# Patient Record
Sex: Female | Born: 1975
Health system: Southern US, Community
[De-identification: ages and names within clinical notes are randomized; demographics above are authoritative.]

## PROBLEM LIST (undated history)

## (undated) DIAGNOSIS — Z87898 Personal history of other specified conditions: Secondary | ICD-10-CM

## (undated) DIAGNOSIS — J301 Allergic rhinitis due to pollen: Secondary | ICD-10-CM

## (undated) DIAGNOSIS — D689 Coagulation defect, unspecified: Secondary | ICD-10-CM

## (undated) DIAGNOSIS — B019 Varicella without complication: Secondary | ICD-10-CM

## (undated) DIAGNOSIS — J45909 Unspecified asthma, uncomplicated: Secondary | ICD-10-CM

## (undated) DIAGNOSIS — R011 Cardiac murmur, unspecified: Secondary | ICD-10-CM

## (undated) DIAGNOSIS — F419 Anxiety disorder, unspecified: Secondary | ICD-10-CM

## (undated) DIAGNOSIS — M419 Scoliosis, unspecified: Secondary | ICD-10-CM

## (undated) HISTORY — PX: OTHER SURGICAL HISTORY: SHX169

## (undated) HISTORY — DX: Cardiac murmur, unspecified: R01.1

## (undated) HISTORY — DX: Coagulation defect, unspecified: D68.9

## (undated) HISTORY — DX: Scoliosis, unspecified: M41.9

## (undated) HISTORY — DX: Unspecified asthma, uncomplicated: J45.909

## (undated) HISTORY — DX: Varicella without complication: B01.9

## (undated) HISTORY — PX: WISDOM TOOTH EXTRACTION: SHX21

## (undated) HISTORY — DX: Allergic rhinitis due to pollen: J30.1

## (undated) HISTORY — DX: Personal history of other specified conditions: Z87.898

---

## 2006-09-21 ENCOUNTER — Encounter: Payer: Self-pay | Admitting: Maternal & Fetal Medicine

## 2006-11-04 ENCOUNTER — Ambulatory Visit: Payer: Self-pay

## 2006-11-17 ENCOUNTER — Ambulatory Visit: Payer: Self-pay | Admitting: Unknown Physician Specialty

## 2006-12-21 ENCOUNTER — Inpatient Hospital Stay: Payer: Self-pay | Admitting: Obstetrics & Gynecology

## 2007-08-19 LAB — CONVERTED CEMR LAB

## 2009-09-13 ENCOUNTER — Ambulatory Visit: Payer: Self-pay | Admitting: Family Medicine

## 2009-09-13 DIAGNOSIS — J019 Acute sinusitis, unspecified: Secondary | ICD-10-CM | POA: Insufficient documentation

## 2009-09-13 DIAGNOSIS — R002 Palpitations: Secondary | ICD-10-CM | POA: Insufficient documentation

## 2009-09-13 LAB — CONVERTED CEMR LAB
Albumin: 4.3 g/dL (ref 3.5–5.2)
Basophils Relative: 0.3 % (ref 0.0–3.0)
CO2: 30 meq/L (ref 19–32)
Calcium: 9.6 mg/dL (ref 8.4–10.5)
Eosinophils Relative: 1.5 % (ref 0.0–5.0)
HDL: 62.3 mg/dL (ref >39.00)
LDL Cholesterol: 85 mg/dL (ref 0–99)
Lymphocytes Relative: 30 % (ref 12.0–46.0)
MCV: 89.2 fL (ref 78.0–100.0)
Monocytes Absolute: 0.7 10*3/uL (ref 0.1–1.0)
Monocytes Relative: 12.5 % — ABNORMAL HIGH (ref 3.0–12.0)
Neutrophils Relative %: 55.7 % (ref 43.0–77.0)
RBC: 4.35 M/uL (ref 3.87–5.11)
Sodium: 141 meq/L (ref 135–145)
TSH: 1.67 microintl units/mL (ref 0.35–5.50)
Total CHOL/HDL Ratio: 3
Total Protein: 7.4 g/dL (ref 6.0–8.3)
Triglycerides: 55 mg/dL (ref 0.0–149.0)
VLDL: 11 mg/dL (ref 0.0–40.0)
WBC: 5.5 10*3/uL (ref 4.5–10.5)

## 2010-03-15 ENCOUNTER — Ambulatory Visit: Payer: Self-pay | Admitting: Family Medicine

## 2010-03-15 DIAGNOSIS — K625 Hemorrhage of anus and rectum: Secondary | ICD-10-CM | POA: Insufficient documentation

## 2010-03-15 LAB — CONVERTED CEMR LAB: OCCULT 1: POSITIVE

## 2010-11-26 NOTE — Assessment & Plan Note (Signed)
Summary: RECTAL BLEEDING/DLO   Vital Signs:  Patient profile:   35 year old female Height:      65 inches Weight:      144.13 pounds BMI:     24.07 Temp:     98.2 degrees F oral Pulse rate:   72 / minute BP sitting:   92 / 80  (left arm) Cuff size:   regular  Vitals Entered By: Linde Gillis CMA [facility]) (Mar 15, 2010 12:36 PM) CC: rectal bleeding   History of Present Illness: 35 yo with 2 days of rectal bleeding. bright red blood per rectum, painful to sit.  No pain with defecation. No blood in toilet. No mucous.  No rectal itching.  Has not been constipated. No h/o rectal bleeding in past. Does have h/o vericose veins.  No dizziness, CP, or SOB.  Of note, has pos  Factor V Leiden.  Current Medications (verified): 1)  Folic Acid 1 Mg Tabs (Folic Acid) .... Take 1 Tablet By Mouth Once A Day 2)  Calcium Carbonate-Vitamin D 600-400 Mg-Unit  Tabs (Calcium Carbonate-Vitamin D) .... Take 1 Tablet By Mouth Once A Day 3)  Loratadine 10 Mg Tabs (Loratadine) .... Take 1 Tablet By Mouth Once A Day 4)  Multivitamins   Tabs (Multiple Vitamin) .... Take 1 Tablet By Mouth Once A Day 5)  Sertraline Hcl 50 Mg Tabs (Sertraline Hcl) .... Take Two Weeks Before Onset of Mentral Cycle  Allergies: 1)  ! Penicillin 2)  ! Sulfa  Review of Systems      See HPI General:  Denies chills and fever. GI:  Complains of bloody stools; denies abdominal pain, constipation, dark tarry stools, nausea, and vomiting.  Physical Exam  General:  Well-developed,well-nourished,in no acute distress; alert,appropriate and cooperative throughout examination Rectal:  stool positive for occult blood and external hemorrhoid(s).   Psych:  normally interactive, good eye contact, not anxious appearing, and not depressed appearing.     Impression & Recommendations:  Problem # 1:  RECTAL BLEEDING (ICD-569.3) Assessment New External hemorrhoid- does not appear thromboses. Discussed supportive care. See pt  instructions for details. Orders: Hemoccult Guaiac-1 spec.(in office) ([id number])  Complete Medication List: 1)  Folic Acid 1 Mg Tabs (Folic acid) .... Take 1 tablet by mouth once a day 2)  Calcium Carbonate-vitamin D 600-400 Mg-unit Tabs (Calcium carbonate-vitamin d) .... Take 1 tablet by mouth once a day 3)  Loratadine 10 Mg Tabs (Loratadine) .... Take 1 tablet by mouth once a day 4)  Multivitamins Tabs (Multiple vitamin) .... Take 1 tablet by mouth once a day 5)  Sertraline Hcl 50 Mg Tabs (Sertraline hcl) .... Take two weeks before onset of mentral cycle  Patient Instructions: 1)  Sitz baths several times a day as tolerated. 2)  you can try OTC preparations like preparation H, anusol, etc but they may not relieve many of your symptoms. 3)  Takes time. 4)  Increase fiber consumption. 5)  Call MD on call or go to ER if bleeding increases.  Current Allergies (reviewed today): ! PENICILLIN ! SULFA  Laboratory Results    Wet Mount/KOH  Stool - Occult Blood Hemmoccult #1: positive Date: 03/15/2010

## 2013-01-28 LAB — HM PAP SMEAR: HM Pap smear: NORMAL

## 2014-08-31 ENCOUNTER — Emergency Department: Payer: Self-pay | Admitting: Emergency Medicine

## 2015-01-29 ENCOUNTER — Ambulatory Visit: Payer: Self-pay | Admitting: Nurse Practitioner

## 2015-01-29 ENCOUNTER — Encounter: Payer: Self-pay | Admitting: Nurse Practitioner

## 2015-01-29 ENCOUNTER — Ambulatory Visit (INDEPENDENT_AMBULATORY_CARE_PROVIDER_SITE_OTHER): Payer: BLUE CROSS/BLUE SHIELD | Admitting: Nurse Practitioner

## 2015-01-29 VITALS — BP 108/72 | HR 58 | Temp 98.3°F | Resp 12 | Ht 66.0 in | Wt 146.4 lb

## 2015-01-29 DIAGNOSIS — Z7689 Persons encountering health services in other specified circumstances: Secondary | ICD-10-CM

## 2015-01-29 DIAGNOSIS — Z7189 Other specified counseling: Secondary | ICD-10-CM

## 2015-01-29 DIAGNOSIS — Z1329 Encounter for screening for other suspected endocrine disorder: Secondary | ICD-10-CM

## 2015-01-29 DIAGNOSIS — Z131 Encounter for screening for diabetes mellitus: Secondary | ICD-10-CM

## 2015-01-29 DIAGNOSIS — Z0000 Encounter for general adult medical examination without abnormal findings: Secondary | ICD-10-CM | POA: Insufficient documentation

## 2015-01-29 DIAGNOSIS — O99119 Other diseases of the blood and blood-forming organs and certain disorders involving the immune mechanism complicating pregnancy, unspecified trimester: Secondary | ICD-10-CM

## 2015-01-29 DIAGNOSIS — R238 Other skin changes: Secondary | ICD-10-CM

## 2015-01-29 DIAGNOSIS — D6851 Activated protein C resistance: Secondary | ICD-10-CM

## 2015-01-29 DIAGNOSIS — Z13220 Encounter for screening for lipoid disorders: Secondary | ICD-10-CM

## 2015-01-29 DIAGNOSIS — O9989 Other specified diseases and conditions complicating pregnancy, childbirth and the puerperium: Secondary | ICD-10-CM

## 2015-01-29 NOTE — Patient Instructions (Signed)
Smoking Cessation Quitting smoking is important to your health and has many advantages. However, it is not always easy to quit since nicotine is a very addictive drug. Oftentimes, people try 3 times or more before being able to quit. This document explains the best ways for you to prepare to quit smoking. Quitting takes hard work and a lot of effort, but you can do it. ADVANTAGES OF QUITTING SMOKING  You will live longer, feel better, and live better.  Your body will feel the impact of quitting smoking almost immediately.  Within 20 minutes, blood pressure decreases. Your pulse returns to its normal level.  After 8 hours, carbon monoxide levels in the blood return to normal. Your oxygen level increases.  After 24 hours, the chance of having a heart attack starts to decrease. Your breath, hair, and body stop smelling like smoke.  After 48 hours, damaged nerve endings begin to recover. Your sense of taste and smell improve.  After 72 hours, the body is virtually free of nicotine. Your bronchial tubes relax and breathing becomes easier.  After 2 to 12 weeks, lungs can hold more air. Exercise becomes easier and circulation improves.  The risk of having a heart attack, stroke, cancer, or lung disease is greatly reduced.  After 1 year, the risk of coronary heart disease is cut in half.  After 5 years, the risk of stroke falls to the same as a nonsmoker.  After 10 years, the risk of lung cancer is cut in half and the risk of other cancers decreases significantly.  After 15 years, the risk of coronary heart disease drops, usually to the level of a nonsmoker.  If you are pregnant, quitting smoking will improve your chances of having a healthy baby.  The people you live with, especially any children, will be healthier.  You will have extra money to spend on things other than cigarettes. QUESTIONS TO THINK ABOUT BEFORE ATTEMPTING TO QUIT You may want to talk about your answers with your  health care provider.  Why do you want to quit?  If you tried to quit in the past, what helped and what did not?  What will be the most difficult situations for you after you quit? How will you plan to handle them?  Who can help you through the tough times? Your family? Friends? A health care provider?  What pleasures do you get from smoking? What ways can you still get pleasure if you quit? Here are some questions to ask your health care provider:  How can you help me to be successful at quitting?  What medicine do you think would be best for me and how should I take it?  What should I do if I need more help?  What is smoking withdrawal like? How can I get information on withdrawal? GET READY  Set a quit date.  Change your environment by getting rid of all cigarettes, ashtrays, matches, and lighters in your home, car, or work. Do not let people smoke in your home.  Review your past attempts to quit. Think about what worked and what did not. GET SUPPORT AND ENCOURAGEMENT You have a better chance of being successful if you have help. You can get support in many ways.  Tell your family, friends, and coworkers that you are going to quit and need their support. Ask them not to smoke around you.  Get individual, group, or telephone counseling and support. Programs are available at local hospitals and health centers. Call   your local health department for information about programs in your area.  Spiritual beliefs and practices may help some smokers quit.  Download a "quit meter" on your computer to keep track of quit statistics, such as how long you have gone without smoking, cigarettes not smoked, and money saved.  Get a self-help book about quitting smoking and staying off tobacco. LEARN NEW SKILLS AND BEHAVIORS  Distract yourself from urges to smoke. Talk to someone, go for a walk, or occupy your time with a task.  Change your normal routine. Take a different route to work.  Drink tea instead of coffee. Eat breakfast in a different place.  Reduce your stress. Take a hot bath, exercise, or read a book.  Plan something enjoyable to do every day. Reward yourself for not smoking.  Explore interactive web-based programs that specialize in helping you quit. GET MEDICINE AND USE IT CORRECTLY Medicines can help you stop smoking and decrease the urge to smoke. Combining medicine with the above behavioral methods and support can greatly increase your chances of successfully quitting smoking.  Nicotine replacement therapy helps deliver nicotine to your body without the negative effects and risks of smoking. Nicotine replacement therapy includes nicotine gum, lozenges, inhalers, nasal sprays, and skin patches. Some may be available over-the-counter and others require a prescription.  Antidepressant medicine helps people abstain from smoking, but how this works is unknown. This medicine is available by prescription.  Nicotinic receptor partial agonist medicine simulates the effect of nicotine in your brain. This medicine is available by prescription. Ask your health care provider for advice about which medicines to use and how to use them based on your health history. Your health care provider will tell you what side effects to look out for if you choose to be on a medicine or therapy. Carefully read the information on the package. Do not use any other product containing nicotine while using a nicotine replacement product.  RELAPSE OR DIFFICULT SITUATIONS Most relapses occur within the first 3 months after quitting. Do not be discouraged if you start smoking again. Remember, most people try several times before finally quitting. You may have symptoms of withdrawal because your body is used to nicotine. You may crave cigarettes, be irritable, feel very hungry, cough often, get headaches, or have difficulty concentrating. The withdrawal symptoms are only temporary. They are strongest  when you first quit, but they will go away within 10-14 days. To reduce the chances of relapse, try to:  Avoid drinking alcohol. Drinking lowers your chances of successfully quitting.  Reduce the amount of caffeine you consume. Once you quit smoking, the amount of caffeine in your body increases and can give you symptoms, such as a rapid heartbeat, sweating, and anxiety.  Avoid smokers because they can make you want to smoke.  Do not let weight gain distract you. Many smokers will gain weight when they quit, usually less than 10 pounds. Eat a healthy diet and stay active. You can always lose the weight gained after you quit.  Find ways to improve your mood other than smoking. FOR MORE INFORMATION  www.smokefree.gov  Document Released: 10/07/2001 Document Revised: 02/27/2014 Document Reviewed: 01/22/2012 Exit[employer] Patient Information 2015 [employer]. This information is not intended to replace advice given to you by your health care provider. Make sure you discuss any questions you have with your health care provider.  

## 2015-01-29 NOTE — Progress Notes (Signed)
Pre visit review using our clinic review tool, if applicable. No additional management support is needed unless otherwise documented below in the visit note. 
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
## 2015-01-29 NOTE — Assessment & Plan Note (Signed)
Discussed acute and chronic issues. Reviewed health maintenance measures, PFSHx, and immunizations. Will obtain fasting labs at a lab appointment.

## 2015-02-01 ENCOUNTER — Other Ambulatory Visit (INDEPENDENT_AMBULATORY_CARE_PROVIDER_SITE_OTHER): Payer: BLUE CROSS/BLUE SHIELD

## 2015-02-01 DIAGNOSIS — Z131 Encounter for screening for diabetes mellitus: Secondary | ICD-10-CM

## 2015-02-01 DIAGNOSIS — Z13220 Encounter for screening for lipoid disorders: Secondary | ICD-10-CM

## 2015-02-01 DIAGNOSIS — Z1329 Encounter for screening for other suspected endocrine disorder: Secondary | ICD-10-CM | POA: Diagnosis not present

## 2015-02-01 DIAGNOSIS — R238 Other skin changes: Secondary | ICD-10-CM | POA: Diagnosis not present

## 2015-02-01 LAB — COMPREHENSIVE METABOLIC PANEL
ALT: 39 U/L — AB (ref 0–35)
AST: 27 U/L (ref 0–37)
Albumin: 4.4 g/dL (ref 3.5–5.2)
Alkaline Phosphatase: 67 U/L (ref 39–117)
BILIRUBIN TOTAL: 0.4 mg/dL (ref 0.2–1.2)
BUN: 14 mg/dL (ref 6–23)
CHLORIDE: 103 meq/L (ref 96–112)
CO2: 26 meq/L (ref 19–32)
Calcium: 9.5 mg/dL (ref 8.4–10.5)
Creatinine, Ser: 0.83 mg/dL (ref 0.40–1.20)
GFR: 81.23 mL/min (ref >60.00)
Glucose, Bld: 90 mg/dL (ref 70–99)
Potassium: 4.3 mEq/L (ref 3.5–5.1)
SODIUM: 136 meq/L (ref 135–145)
TOTAL PROTEIN: 7.6 g/dL (ref 6.0–8.3)

## 2015-02-01 LAB — CBC WITH DIFFERENTIAL/PLATELET
BASOS ABS: 0 10*3/uL (ref 0.0–0.1)
Basophils Relative: 0.7 % (ref 0.0–3.0)
EOS ABS: 0.2 10*3/uL (ref 0.0–0.7)
Eosinophils Relative: 3.9 % (ref 0.0–5.0)
HCT: 40.4 % (ref 36.0–46.0)
HEMOGLOBIN: 13.8 g/dL (ref 12.0–15.0)
Lymphocytes Relative: 20.8 % (ref 12.0–46.0)
Lymphs Abs: 1.2 10*3/uL (ref 0.7–4.0)
MCHC: 34.2 g/dL (ref 30.0–36.0)
MCV: 86.5 fl (ref 78.0–100.0)
MONO ABS: 0.5 10*3/uL (ref 0.1–1.0)
MONOS PCT: 9 % (ref 3.0–12.0)
NEUTROS ABS: 3.8 10*3/uL (ref 1.4–7.7)
Neutrophils Relative %: 65.6 % (ref 43.0–77.0)
PLATELETS: 200 10*3/uL (ref 150.0–400.0)
RBC: 4.67 Mil/uL (ref 3.87–5.11)
RDW: 13.6 % (ref 11.5–15.5)
WBC: 5.8 10*3/uL (ref 4.0–10.5)

## 2015-02-01 LAB — LIPID PANEL
CHOL/HDL RATIO: 3
CHOLESTEROL: 179 mg/dL (ref 0–200)
HDL: 69.9 mg/dL (ref >39.00)
LDL Cholesterol: 100 mg/dL — ABNORMAL HIGH (ref 0–99)
NONHDL: 109.1
Triglycerides: 44 mg/dL (ref 0.0–149.0)
VLDL: 8.8 mg/dL (ref 0.0–40.0)

## 2015-02-01 LAB — TSH: TSH: 1.71 u[IU]/mL (ref 0.35–4.50)

## 2015-02-01 LAB — HEMOGLOBIN A1C: HEMOGLOBIN A1C: 5.5 % (ref 4.6–6.5)

## 2015-02-04 DIAGNOSIS — R238 Other skin changes: Secondary | ICD-10-CM | POA: Insufficient documentation

## 2015-02-04 NOTE — Assessment & Plan Note (Signed)
Pt found out when having miscarriages. Pt was seen by hematologist (does not recall name or any other information). Will obtain records

## 2015-02-04 NOTE — Assessment & Plan Note (Signed)
Amb referral to Cardiology to explore "functional heart murmur" and see about microcirculation. Pt will need echo to rule out other heart conditions. Dusky discoloration is more pronounced and all the time vs. Just in winter. She reports painful hands in winter time. Fam hx- mother and sister have same issue and no one has been worked up she reports. Toes are also affected.

## 2015-02-22 ENCOUNTER — Ambulatory Visit (INDEPENDENT_AMBULATORY_CARE_PROVIDER_SITE_OTHER): Payer: BLUE CROSS/BLUE SHIELD | Admitting: Cardiovascular Disease

## 2015-02-22 ENCOUNTER — Encounter: Payer: Self-pay | Admitting: Cardiovascular Disease

## 2015-02-22 VITALS — BP 94/72 | HR 46 | Ht 65.0 in | Wt 136.5 lb

## 2015-02-22 DIAGNOSIS — R002 Palpitations: Secondary | ICD-10-CM

## 2015-02-22 DIAGNOSIS — I7300 Raynaud's syndrome without gangrene: Secondary | ICD-10-CM | POA: Diagnosis not present

## 2015-02-22 DIAGNOSIS — R011 Cardiac murmur, unspecified: Secondary | ICD-10-CM

## 2015-02-22 DIAGNOSIS — Z720 Tobacco use: Secondary | ICD-10-CM | POA: Insufficient documentation

## 2015-02-22 NOTE — Assessment & Plan Note (Signed)
I requested an echocardiogram for evaluation.

## 2015-02-22 NOTE — Assessment & Plan Note (Signed)
I strongly advised her to quit smoking. 

## 2015-02-22 NOTE — Patient Instructions (Signed)
Medication Instructions: - no change  Labwork: - none  Procedures/Testing: - Your physician has requested that you have an echocardiogram. Echocardiography is a painless test that uses sound waves to create images of your heart. It provides your doctor with information about the size and shape of your heart and how well your heart's chambers and valves are working. This procedure takes approximately one hour. There are no restrictions for this procedure.  Follow-Up: - Your physician wants you to follow-up in: 1 year with Dr. Fletcher Anon. You will receive a reminder letter in the mail two months in advance. If you don't receive a letter, please call our office to schedule the follow-up appointment.  Any Additional Special Instructions Will Be Listed Below (If Applicable). - none

## 2015-02-22 NOTE — Progress Notes (Signed)
Primary care provider: Lorane Gell  HPI  This is a pleasant 39 year old female who was referred for evaluation of a cardiac murmur and dusky discoloration of the fingers. She reports prolonged history of a cardiac murmur since childhood with no evaluation with an echocardiogram. She also complains of numbness and discoloration of the fingertips which is mostly noticeable in the wintertime. The fingers turn pale then bluish discoloration follows. This has been associated with mild pain and discomfort. Toes are affected but to this degree. Her mother and sister both have very similar symptoms. Otherwise she denies any chest pain or shortness of breath. She smokes 4-5 cigarettes a day.  She reports history of factor V Leiden in 2003 when she had miscarriages and was evaluated by a hematologist. She has no other joint issues or evidence of connective tissue disease. She is very active and exercises regularly. There is no family history of premature coronary artery disease.  Allergies  Allergen Reactions  . Penicillins     REACTION: Rash  . Sulfonamide Derivatives     REACTION: Rash     Current Outpatient Prescriptions on File Prior to Visit  Medication Sig Dispense Refill  . cetirizine (ZYRTEC) 10 MG tablet Take 10 mg by mouth daily.    Marland Kitchen Specialty Vitamins Products (MAGNESIUM, AMINO ACID CHELATE,) 133 MG tablet Take 1 tablet by mouth 2 (two) times daily.     No current facility-administered medications on file prior to visit.     Past Medical History  Diagnosis Date  . Asthma   . Chicken pox   . Heart murmur     functional  . Hay fever   . Clotting disorder     Factor 5 Liden  . Scoliosis      Past Surgical History  Procedure Laterality Date  . Tonsil    . Cesarean section      2004. 2008  . Wisdom tooth extraction Bilateral      Family History  Problem Relation Age of Onset  . Arthritis Mother   . Arthritis Maternal Grandmother   . Heart disease Maternal Grandmother    . Hypertension Maternal Grandmother   . Cancer Maternal Grandmother     breast  . Heart disease Paternal Grandmother   . Diabetes Paternal Grandmother   . Heart murmur Father      History   Social History  . Marital Status: Married    Spouse Name: N/A  . Number of Children: N/A  . Years of Education: N/A   Occupational History  . Not on file.   Social History Main Topics  . Smoking status: Current Every Day Smoker -- 0.25 packs/day    Types: Cigarettes  . Smokeless tobacco: Never Used     Comment: Smokes a couple per day.  . Alcohol Use: No     Comment: Rarely   . Drug Use: No  . Sexual Activity:    Partners: Male     Comment: Husband   Other Topics Concern  . Not on file   Social History Narrative   Works at [employer] for a company also    Lives with husband and 2 sons (8 and 4)    Pets: 1 dog inside    Caffeine- 5 cups coffee, green tea    Right handed    Bachelor's degree   Enjoys- relaxing      ROS A 10 point review of system was performed. It is negative other  than that mentioned in the history of present illness.   PHYSICAL EXAM   BP 94/72 mmHg  Pulse 46  Ht 5\' 5"  (1.651 m)  Wt 136 lb 8 oz (61.916 kg)  BMI 22.71 kg/m2  LMP 01/11/2015 Constitutional: She is oriented to person, place, and time. She appears well-developed and well-nourished. No distress.  HENT: No nasal discharge.  Head: Normocephalic and atraumatic.  Eyes: Pupils are equal and round. No discharge.  Neck: Normal range of motion. Neck supple. No JVD present. No thyromegaly present.  Cardiovascular: Normal rate, regular rhythm, normal heart sounds. Exam reveals no gallop and no friction rub. There is one out of 6 systolic ejection murmur at the aortic area  Pulmonary/Chest: Effort normal and breath sounds normal. No stridor. No respiratory distress. She has no wheezes. She has no rales. She exhibits no tenderness.  Abdominal: Soft.  Bowel sounds are normal. She exhibits no distension. There is no tenderness. There is no rebound and no guarding.  Musculoskeletal: Normal range of motion. She exhibits no edema and no tenderness.  Neurological: She is alert and oriented to person, place, and time. Coordination normal.  Skin: Skin is warm and dry. No rash noted. She is not diaphoretic. No erythema. No pallor.  Psychiatric: She has a normal mood and affect. Her behavior is normal. Judgment and thought content normal.  Radial ulnar pulses are normal bilaterally. The fingers are cold to touch.  IOE:VOJJKK sinus  Bradycardia  BORDERLINE RHYTHM    ASSESSMENT AND PLAN

## 2015-02-22 NOTE — Assessment & Plan Note (Signed)
The patient's symptoms are consistent with Raynaud's phenomenon affecting mostly her fingers and to a lesser degree the toes. Symptoms are overall mild. She has no clinical signs or symptoms of vasculitis or connective tissue disease. Thus, I suspect that this is likely primary. She is also not on any medications that can cause this. I am not aware that factor V Leiden is associated with this. I discussed with her the importance of avoiding cold exposure and triggers. I also advised her to quit smoking which would likely improve her symptoms. In terms of medications, I recommend treatment with calcium channel blockers only if symptoms worsen. Currently, we are limited by low blood pressure

## 2015-03-19 ENCOUNTER — Other Ambulatory Visit: Payer: Self-pay

## 2015-03-19 ENCOUNTER — Ambulatory Visit (INDEPENDENT_AMBULATORY_CARE_PROVIDER_SITE_OTHER): Payer: BLUE CROSS/BLUE SHIELD

## 2015-03-19 DIAGNOSIS — R011 Cardiac murmur, unspecified: Secondary | ICD-10-CM | POA: Diagnosis not present

## 2015-03-27 ENCOUNTER — Telehealth: Payer: Self-pay | Admitting: *Deleted

## 2015-03-27 NOTE — Telephone Encounter (Signed)
Pt is returning our call for echo results. Please call when ready.

## 2015-11-15 ENCOUNTER — Ambulatory Visit
Admission: RE | Admit: 2015-11-15 | Discharge: 2015-11-15 | Disposition: A | Discharge status: Home / Self Care | Payer: BLUE CROSS/BLUE SHIELD | Source: Ambulatory Visit | Attending: Nurse Practitioner | Admitting: Nurse Practitioner

## 2015-11-15 ENCOUNTER — Encounter: Payer: Self-pay | Admitting: Nurse Practitioner

## 2015-11-15 ENCOUNTER — Ambulatory Visit (INDEPENDENT_AMBULATORY_CARE_PROVIDER_SITE_OTHER): Payer: BLUE CROSS/BLUE SHIELD | Admitting: Nurse Practitioner

## 2015-11-15 VITALS — BP 102/70 | HR 53 | Temp 98.2°F | Ht 66.0 in | Wt 145.2 lb

## 2015-11-15 DIAGNOSIS — R1032 Left lower quadrant pain: Secondary | ICD-10-CM | POA: Insufficient documentation

## 2015-11-15 LAB — CBC WITH DIFFERENTIAL/PLATELET
BASOS PCT: 0.6 % (ref 0.0–3.0)
Basophils Absolute: 0 10*3/uL (ref 0.0–0.1)
EOS PCT: 2.9 % (ref 0.0–5.0)
Eosinophils Absolute: 0.2 10*3/uL (ref 0.0–0.7)
HCT: 41.1 % (ref 36.0–46.0)
Hemoglobin: 13.6 g/dL (ref 12.0–15.0)
Lymphocytes Relative: 22 % (ref 12.0–46.0)
Lymphs Abs: 1.6 10*3/uL (ref 0.7–4.0)
MCHC: 33 g/dL (ref 30.0–36.0)
MCV: 89.6 fl (ref 78.0–100.0)
MONO ABS: 0.5 10*3/uL (ref 0.1–1.0)
Monocytes Relative: 6.9 % (ref 3.0–12.0)
NEUTROS ABS: 5 10*3/uL (ref 1.4–7.7)
Neutrophils Relative %: 67.6 % (ref 43.0–77.0)
PLATELETS: 212 10*3/uL (ref 150.0–400.0)
RBC: 4.59 Mil/uL (ref 3.87–5.11)
RDW: 13.7 % (ref 11.5–15.5)
WBC: 7.4 10*3/uL (ref 4.0–10.5)

## 2015-11-15 LAB — COMPREHENSIVE METABOLIC PANEL
ALT: 32 U/L (ref 0–35)
AST: 19 U/L (ref 0–37)
Albumin: 4.1 g/dL (ref 3.5–5.2)
Alkaline Phosphatase: 49 U/L (ref 39–117)
BILIRUBIN TOTAL: 0.3 mg/dL (ref 0.2–1.2)
BUN: 14 mg/dL (ref 6–23)
CO2: 28 mEq/L (ref 19–32)
CREATININE: 0.69 mg/dL (ref 0.40–1.20)
Calcium: 9.1 mg/dL (ref 8.4–10.5)
Chloride: 107 mEq/L (ref 96–112)
GFR: 100.13 mL/min (ref >60.00)
Glucose, Bld: 83 mg/dL (ref 70–99)
Potassium: 4.2 mEq/L (ref 3.5–5.1)
Sodium: 139 mEq/L (ref 135–145)
Total Protein: 7 g/dL (ref 6.0–8.3)

## 2015-11-15 LAB — LIPASE: LIPASE: 10 U/L — AB (ref 11.0–59.0)

## 2015-11-15 NOTE — Progress Notes (Signed)
Pre visit review using our clinic review tool, if applicable. No additional management support is needed unless otherwise documented below in the visit note. 

## 2015-11-15 NOTE — Patient Instructions (Signed)
Please visit the lab and then go to get your x-ray  [facility] [address], [place] [address] Hours of Operation Monday - Friday, 8 a.m. - 5 p.m. Main: [phone]

## 2015-11-15 NOTE — Progress Notes (Signed)
Patient ID: Heather Fisher, female    DOB: 1976/07/05  Age: 40 y.o. MRN: [id number]  CC: Abdominal Pain   HPI Heather Fisher presents for CC of left abdominal pain x  6 days.   1) Great yesterday until 3 pm  No nausea, vomiting, diarrhea  Thought it was a gas pain Passing gas does not clear pain  Sometimes hurts worse, but not correlated with a position or food  BM- Hard to pass this morning   No sick contacts  Feels better with food  Eating more simple carbs recently- crackers Tums-worse   No kidney stone history  History Heather Fisher has a past medical history of Asthma; Chicken pox; Heart murmur; Hay fever; Clotting disorder ([place]); and Scoliosis.   She has past surgical history that includes tonsil; Cesarean section; and Wisdom tooth extraction (Bilateral).   Her family history includes Arthritis in her maternal grandmother and mother; Cancer in her maternal grandmother; Diabetes in her paternal grandmother; Heart disease in her maternal grandmother and paternal grandmother; Heart murmur in her father; Hypertension in her maternal grandmother.She reports that she has been smoking Cigarettes.  She has been smoking about 0.25 packs per day. She has never used smokeless tobacco. She reports that she does not drink alcohol or use illicit drugs.  Outpatient Prescriptions Prior to Visit  Medication Sig Dispense Refill  . cetirizine (ZYRTEC) 10 MG tablet Take 10 mg by mouth daily.    Marland Kitchen Specialty Vitamins Products (MAGNESIUM, AMINO ACID CHELATE,) 133 MG tablet Take 1 tablet by mouth 2 (two) times daily.     No facility-administered medications prior to visit.    ROS Review of Systems  Constitutional: Positive for chills. Negative for fever, diaphoresis and fatigue.  Gastrointestinal: Positive for abdominal pain. Negative for nausea, vomiting, diarrhea, constipation, blood in stool, abdominal distention and anal bleeding.  Skin: Negative for rash.    Objective:  BP 102/70 mmHg   Pulse 53  Temp(Src) 98.2 F (36.8 C) (Oral)  Ht '5\' 6"'  (1.676 m)  Wt 145 lb 4 oz (65.885 kg)  BMI 23.46 kg/m2  SpO2 97%  LMP 10/29/2015  Physical Exam  Constitutional: She is oriented to person, place, and time. She appears well-developed and well-nourished. No distress.  HENT:  Head: Normocephalic and atraumatic.  Right Ear: External ear normal.  Left Ear: External ear normal.  Cardiovascular: Normal rate, regular rhythm and normal heart sounds.   Pulmonary/Chest: Effort normal and breath sounds normal. No respiratory distress. She has no wheezes. She has no rales. She exhibits no tenderness.  Abdominal: Soft. Bowel sounds are normal. She exhibits no distension and no mass. There is tenderness. There is guarding. There is no rebound.    Small area of tenderness to palpation, some guarding at first, able to fully palpate after a few seconds  Neurological: She is alert and oriented to person, place, and time. No cranial nerve deficit. She exhibits normal muscle tone. Coordination normal.  Skin: Skin is warm and dry. No rash noted. She is not diaphoretic.  Psychiatric: She has a normal mood and affect. Her behavior is normal. Judgment and thought content normal.   Assessment & Plan:   Heather Fisher was seen today for abdominal pain.  Diagnoses and all orders for this visit:  Abdominal pain, left lower quadrant -     CBC w/Diff -     Lipase -     Comp Met (CMET) -     DG Abd 1 View; Future   I  am having Heather Fisher maintain her magnesium (amino acid chelate) and cetirizine.  No orders of the defined types were placed in this encounter.     Follow-up: Return if symptoms worsen or fail to improve.

## 2015-11-16 DIAGNOSIS — R1032 Left lower quadrant pain: Secondary | ICD-10-CM | POA: Insufficient documentation

## 2015-11-16 NOTE — Assessment & Plan Note (Signed)
Will obtain X-ray of Abdomen  CBC w/ diff, CMET, and Lipase levels Bland diet and gas x/colace OTC until results come back FU prn worsening/failure to improve.

## 2016-07-24 DIAGNOSIS — Z23 Encounter for immunization: Secondary | ICD-10-CM | POA: Diagnosis not present

## 2016-09-05 ENCOUNTER — Encounter: Payer: Self-pay | Admitting: Cardiovascular Disease

## 2016-09-05 ENCOUNTER — Ambulatory Visit (INDEPENDENT_AMBULATORY_CARE_PROVIDER_SITE_OTHER): Payer: BLUE CROSS/BLUE SHIELD | Admitting: Cardiovascular Disease

## 2016-09-05 VITALS — BP 115/86 | HR 55 | Ht 65.0 in | Wt 153.0 lb

## 2016-09-05 DIAGNOSIS — I7300 Raynaud's syndrome without gangrene: Secondary | ICD-10-CM

## 2016-09-05 DIAGNOSIS — R011 Cardiac murmur, unspecified: Secondary | ICD-10-CM

## 2016-09-05 NOTE — Patient Instructions (Signed)
Medication Instructions: No changes.   Labwork: None.   Procedures/Testing: None.   Follow-Up: As needed with Dr. Arida.   Any Additional Special Instructions Will Be Listed Below (If Applicable).     If you need a refill on your cardiac medications before your next appointment, please call your pharmacy.   

## 2016-09-05 NOTE — Progress Notes (Signed)
Cardiology Office Note   Date:  09/05/2016   ID:  Heather Fisher, DOB [birth year], MRN [id number]  PCP:  Rubbie Battiest, NP  Cardiologist:   Kathlyn Sacramento, MD   Chief Complaint  Patient presents with  . Other    12 month follow up. Meds reviewed by the pt. verbally. "doing well."       History of Present Illness: Heather Fisher is a 40 y.o. female who presents for A follow-up visit regarding Raynaud's disease.  She had an echocardiogram done last year for a previous cardiac murmur. The echocardiogram was normal with no evidence of valvular heart disease. She quit smoking last year. She reports history of factor V Leiden in 2003 when she had miscarriages and was evaluated by a hematologist. She has no other joint issues or evidence of connective tissue disease. Her hand symptoms were overall mild and did not require treatment. She reports stability of symptoms. She continues to have cold intolerance.   Past Medical History:  Diagnosis Date  . Asthma   . Chicken pox   . Clotting disorder ([place])    Factor 5 Liden  . Hay fever   . Heart murmur    functional  . Scoliosis     Past Surgical History:  Procedure Laterality Date  . CESAREAN SECTION     2004. 2008  . tonsil    . WISDOM TOOTH EXTRACTION Bilateral      Current Outpatient Prescriptions  Medication Sig Dispense Refill  . cetirizine (ZYRTEC) 10 MG tablet Take 10 mg by mouth daily.    Marland Kitchen Specialty Vitamins Products (MAGNESIUM, AMINO ACID CHELATE,) 133 MG tablet Take 1 tablet by mouth 2 (two) times daily.     No current facility-administered medications for this visit.     Allergies:   Penicillins and Sulfonamide derivatives    Social History:  The patient  reports that she quit smoking about 4 months ago. Her smoking use included Cigarettes. She smoked 0.25 packs per day. She has never used smokeless tobacco. She reports that she does not drink alcohol or use drugs.   Family History:  The patient's family  history includes Arthritis in her maternal grandmother and mother; Cancer in her maternal grandmother; Diabetes in her paternal grandmother; Heart disease in her maternal grandmother and paternal grandmother; Heart murmur in her father; Hypertension in her maternal grandmother.    ROS:  Please see the history of present illness.   Otherwise, review of systems are positive for none.   All other systems are reviewed and negative.    PHYSICAL EXAM: VS:  BP 115/86 (BP Location: Left Arm, Patient Position: Sitting, Cuff Size: Normal)   Pulse (!) 55   Ht 5\' 5"  (1.651 m)   Wt 153 lb (69.4 kg)   BMI 25.46 kg/m  , BMI Body mass index is 25.46 kg/m. GEN: Well nourished, well developed, in no acute distress  HEENT: normal  Neck: no JVD, carotid bruits, or masses Cardiac: RRR; no murmurs, rubs, or gallops,no edema  Respiratory:  clear to auscultation bilaterally, normal work of breathing GI: soft, nontender, nondistended, + BS MS: no deformity or atrophy  Skin: warm and dry, no rash Neuro:  Strength and sensation are intact Psych: euthymic mood, full affect   EKG:  EKG is ordered today. The ekg ordered today demonstrates sinus bradycardia with no significant ST or T wave changes.   Recent Labs: 11/15/2015: ALT 32; BUN 14; Creatinine, Ser 0.69; Hemoglobin 13.6; Platelets  212.0; Potassium 4.2; Sodium 139    Lipid Panel    Component Value Date/Time   CHOL 179 02/01/2015 0814   TRIG 44.0 02/01/2015 0814   HDL 69.90 02/01/2015 0814   CHOLHDL 3 02/01/2015 0814   VLDL 8.8 02/01/2015 0814   LDLCALC 100 (H) 02/01/2015 0814      Wt Readings from Last 3 Encounters:  09/05/16 153 lb (69.4 kg)  11/15/15 145 lb 4 oz (65.9 kg)  02/22/15 136 lb 8 oz (61.9 kg)       No flowsheet data found.    ASSESSMENT AND PLAN:  1.  Primary Raynaud's disease: Her symptoms continued to be overall mild and do not require treatment at the present time. She can follow-up with me as needed.  2. Reported  heart murmur with normal echocardiogram last year. No heart murmurs detected by physical exam today.    Disposition:   FU with with me as needed if symptoms worsen.  Signed,  Kathlyn Sacramento, MD  09/05/2016 3:23 PM    [facility]

## 2016-10-23 ENCOUNTER — Telehealth: Payer: Self-pay | Admitting: Nurse Practitioner

## 2016-10-23 NOTE — Telephone Encounter (Signed)
Pt called and stated that her mom has tested positive for Type A of the flu and was advised to call her pcp to get some preventative medication. Pt has not established with anyone since [employer] Please advise, thank you!  Call tp @ [phone]

## 2016-10-24 ENCOUNTER — Other Ambulatory Visit: Payer: Self-pay | Admitting: Family Medicine

## 2016-10-24 MED ORDER — OSELTAMIVIR PHOSPHATE 75 MG PO CAPS: 75.0000 mg | ORAL_CAPSULE | Freq: Every day | ORAL | 0 refills | Status: DC

## 2016-10-24 NOTE — Telephone Encounter (Signed)
Patient advised and verbalized understanding 

## 2016-10-24 NOTE — Telephone Encounter (Signed)
Left message to call on voicemail  

## 2016-10-24 NOTE — Telephone Encounter (Signed)
Patient calls requesting medication to be called in for flu mom tested positive for type  A flu.   Would you be willing to call in mediation to prophylactic. Please advise.  Last office visit 11/15/15 with Lorane Gell , NP  Nov scheduled

## 2016-10-24 NOTE — Telephone Encounter (Signed)
Prophylactic tamiflu sent.

## 2016-10-24 NOTE — Telephone Encounter (Signed)
Pt called back returning your call °

## 2016-12-08 DIAGNOSIS — M722 Plantar fascial fibromatosis: Secondary | ICD-10-CM | POA: Diagnosis not present

## 2017-04-16 ENCOUNTER — Telehealth: Payer: Self-pay | Admitting: Family

## 2017-04-16 ENCOUNTER — Encounter: Payer: Self-pay | Admitting: Family

## 2017-04-16 ENCOUNTER — Ambulatory Visit (INDEPENDENT_AMBULATORY_CARE_PROVIDER_SITE_OTHER): Payer: BLUE CROSS/BLUE SHIELD | Admitting: Family

## 2017-04-16 VITALS — BP 100/60 | HR 50 | Temp 98.1°F | Ht 65.0 in | Wt 155.8 lb

## 2017-04-16 DIAGNOSIS — R140 Abdominal distension (gaseous): Secondary | ICD-10-CM

## 2017-04-16 DIAGNOSIS — Z23 Encounter for immunization: Secondary | ICD-10-CM

## 2017-04-16 DIAGNOSIS — Z0000 Encounter for general adult medical examination without abnormal findings: Secondary | ICD-10-CM | POA: Diagnosis not present

## 2017-04-16 NOTE — Telephone Encounter (Signed)
Pt called about needing a Rx to get a tetanus shot at the pharmacy @ [id number] - [place], [place] is there now. Thank you!  Call pt @ [phone].

## 2017-04-16 NOTE — Assessment & Plan Note (Signed)
Reassured by normal abdominal exam. Offered patient ultrasound or CT scan of abdomen, she politely declined. She would like to monitor food choices, see nutritionist, trial Zantac prior to imaging which I think is appropriate. Pending celiac studies. Patient will make follow-up appointment with me.

## 2017-04-16 NOTE — Progress Notes (Signed)
Subjective:    Patient ID: Heather Fisher, female    DOB: Jul 18, 1976, 41 y.o.   MRN: [id number]  CC: Heather Fisher is a 41 y.o. female who presents today for physical exam.    HPI:  Does also complain of more gas and abdominal bloating after eating, several months, unchanged. Bloating most days.  Takes tums which helps. Eats well most of time, occasional bingeing.  No epigastric pain. Burping about the same. No sour taste in month. Bloating, gas worse around menstrual cycle.  No n, v, fever, blood in stool, dyspareunia.   Had been on cymbalta for 'PMS', no longer on as symptoms improved.   H/o factor V leiden.      Colorectal Cancer Screening: No early family history Breast Cancer Screening: Mammogram due Cervical Cancer Screening: due per patient; follows with [facility] and will make an appointment Bone Health screening/DEXA for 65+: No increased fracture risk. Defer screening at this time. Lung Cancer Screening: Doesn't have 30 year pack year history and age > 65 years. Immunizations       Tetanus - due HIV Screening- Candidate for, declines Labs: Screening labs today. Exercise: No exercise.  Alcohol use: rarely Smoking/tobacco use: former smoker.  Regular dental exams: utd Wears seat belt: Yes. Skin: follows with [facility]; h/o skin cancer  HISTORY:  Past Medical History:  Diagnosis Date  . Asthma   . Chicken pox   . Clotting disorder ([place])    Factor 5 Liden  . Hay fever   . Heart murmur    functional  . Scoliosis     Past Surgical History:  Procedure Laterality Date  . CESAREAN SECTION     2004. 2008  . tonsil    . WISDOM TOOTH EXTRACTION Bilateral    Family History  Problem Relation Age of Onset  . Arthritis Mother   . Arthritis Maternal Grandmother   . Heart disease Maternal Grandmother   . Hypertension Maternal Grandmother   . Cancer Maternal Grandmother 79       breast  . Heart disease Paternal Grandmother   . Diabetes Paternal  Grandmother   . Heart murmur Father       ALLERGIES: Penicillins and Sulfonamide derivatives  Current Outpatient Prescriptions on File Prior to Visit  Medication Sig Dispense Refill  . cetirizine (ZYRTEC) 10 MG tablet Take 10 mg by mouth daily.    Marland Kitchen Specialty Vitamins Products (MAGNESIUM, AMINO ACID CHELATE,) 133 MG tablet Take 1 tablet by mouth 2 (two) times daily.     No current facility-administered medications on file prior to visit.     Social History  Substance Use Topics  . Smoking status: Former Smoker    Packs/day: 0.25    Types: Cigarettes    Quit date: 04/20/2016  . Smokeless tobacco: Never Used     Comment: Smokes a couple per day.  . Alcohol use No     Comment: Rarely     Review of Systems  Constitutional: Negative for chills, fever and unexpected weight change.  HENT: Negative for congestion.   Respiratory: Negative for cough.   Cardiovascular: Negative for chest pain, palpitations and leg swelling.  Gastrointestinal: Positive for abdominal distention. Negative for abdominal pain, anal bleeding, blood in stool, constipation, diarrhea, nausea and vomiting.  Genitourinary: Negative for dyspareunia and dysuria.  Musculoskeletal: Negative for arthralgias and myalgias.  Skin: Negative for rash.  Neurological: Negative for headaches.  Hematological: Negative for adenopathy.  Psychiatric/Behavioral: Negative for confusion.  Objective:    BP 100/60   Pulse (!) 50   Temp 98.1 F (36.7 C) (Oral)   Ht 5\' 5"  (1.651 m)   Wt 155 lb 12.8 oz (70.7 kg)   SpO2 99%   BMI 25.93 kg/m   BP Readings from Last 3 Encounters:  04/16/17 100/60  09/05/16 115/86  11/15/15 102/70   Wt Readings from Last 3 Encounters:  04/16/17 155 lb 12.8 oz (70.7 kg)  09/05/16 153 lb (69.4 kg)  11/15/15 145 lb 4 oz (65.9 kg)    Physical Exam  Constitutional: She appears well-developed and well-nourished.  Eyes: Conjunctivae are normal.  Neck: No thyroid mass and no thyromegaly  present.  Cardiovascular: Normal rate, regular rhythm, normal heart sounds and normal pulses.   Pulmonary/Chest: Effort normal and breath sounds normal. She has no wheezes. She has no rhonchi. She has no rales. Right breast exhibits no inverted nipple, no mass, no nipple discharge, no skin change and no tenderness. Left breast exhibits no inverted nipple, no mass, no nipple discharge, no skin change and no tenderness. Breasts are symmetrical.  CBE performed.   Abdominal: Soft. Normal appearance and bowel sounds are normal. She exhibits no distension, no fluid wave, no ascites and no mass. There is no tenderness. There is no rigidity, no rebound, no guarding and no CVA tenderness.  Lymphadenopathy:       Head (right side): No submental, no submandibular, no tonsillar, no preauricular, no posterior auricular and no occipital adenopathy present.       Head (left side): No submental, no submandibular, no tonsillar, no preauricular, no posterior auricular and no occipital adenopathy present.    She has no cervical adenopathy.       Right cervical: No superficial cervical, no deep cervical and no posterior cervical adenopathy present.      Left cervical: No superficial cervical, no deep cervical and no posterior cervical adenopathy present.    She has no axillary adenopathy.  Neurological: She is alert.  Skin: Skin is warm and dry.  Psychiatric: She has a normal mood and affect. Her speech is normal and behavior is normal. Thought content normal.  Vitals reviewed.      Assessment & Plan:   Problem List Items Addressed This Visit      Other   Routine physical examination - Primary    Mammogram ordered and patient understands to schedule. Patient appointment with OB/GYN for Pap smear. Advised tetanus vaccine a local pharmacy. Breast exam performed today. Congratulated her on stopping smoking. Encouraged to start exercise program especially since stopped smoking, and with recent weight gain.       Relevant Orders   MM SCREENING BREAST TOMO BILATERAL   CBC with Differential/Platelet   Comprehensive metabolic panel   Hemoglobin A1c   Lipid panel   TSH   VITAMIN D 25 Hydroxy (Vit-D Deficiency, Fractures)   Celiac Disease Ab Screen w/Rfx   Referral to Nutrition and Diabetes Services   Abdominal bloating    Reassured by normal abdominal exam. Offered patient ultrasound or CT scan of abdomen, she politely declined. She would like to monitor food choices, see nutritionist, trial Zantac prior to imaging which I think is appropriate. Pending celiac studies. Patient will make follow-up appointment with me.      Relevant Orders   Celiac Disease Ab Screen w/Rfx   Referral to Nutrition and Diabetes Services       I have discontinued Ms. Feehan's oseltamivir. I am also having her maintain her  magnesium (amino acid chelate) and cetirizine.   No orders of the defined types were placed in this encounter.   Return precautions given.   Risks, benefits, and alternatives of the medications and treatment plan prescribed today were discussed, and patient expressed understanding.   Education regarding symptom management and diagnosis given to patient on AVS.   Continue to follow with Burnard Hawthorne, FNP for routine health maintenance.   [place] E Shor and I agreed with plan.   Mable Paris, FNP

## 2017-04-16 NOTE — Telephone Encounter (Signed)
done

## 2017-04-16 NOTE — Patient Instructions (Addendum)
Trial zantac twice a day  Pay attention to foods that cause symptoms  tdap at local pharmacy   Pleasure meeting you  We placed a referral. Mammogram this year. I asked that you call one the below locations and schedule this when it is convenient for you.   If you have dense breasts, you may ask for 3D mammogram over the traditional 2D mammogram as new evidence suggest 3D is superior. Please note that NOT all insurance companies cover 3D and you may have to pay a higher copay. You may call your insurance company to further clarify your benefits.   Options for [place]  [place], [place]  * [facility] [place], [place] * Note if you ask for 3D mammogram at this location, you must request [place], [place] location*      Health Maintenance, Female Adopting a healthy lifestyle and getting preventive care can go a long way to promote health and wellness. Talk with your health care provider about what schedule of regular examinations is right for you. This is a good chance for you to check in with your provider about disease prevention and staying healthy. In between checkups, there are plenty of things you can do on your own. Experts have done a lot of research about which lifestyle changes and preventive measures are most likely to keep you healthy. Ask your health care provider for more information. Weight and diet Eat a healthy diet  Be sure to include plenty of vegetables, fruits, low-fat dairy products, and lean protein.  Do not eat a lot of foods high in solid fats, added sugars, or salt.  Get regular exercise. This is one of the most important things you can do for your health. ? Most adults should exercise for at least 150 minutes each week. The exercise should increase your heart rate and make you sweat (moderate-intensity  exercise). ? Most adults should also do strengthening exercises at least twice a week. This is in addition to the moderate-intensity exercise.  Maintain a healthy weight  Body mass index (BMI) is a measurement that can be used to identify possible weight problems. It estimates body fat based on height and weight. Your health care provider can help determine your BMI and help you achieve or maintain a healthy weight.  For females 46 years of age and older: ? A BMI below 18.5 is considered underweight. ? A BMI of 18.5 to 24.9 is normal. ? A BMI of 25 to 29.9 is considered overweight. ? A BMI of 30 and above is considered obese.  Watch levels of cholesterol and blood lipids  You should start having your blood tested for lipids and cholesterol at 41 years of age, then have this test every 5 years.  You may need to have your cholesterol levels checked more often if: ? Your lipid or cholesterol levels are high. ? You are older than 41 years of age. ? You are at high risk for heart disease.  Cancer screening Lung Cancer  Lung cancer screening is recommended for adults 32-47 years old who are at high risk for lung cancer because of a history of smoking.  A yearly low-dose CT scan of the lungs is recommended for people who: ? Currently smoke. ? Have quit within the past 15 years. ? Have at least a 30-pack-year history of smoking. A  pack year is smoking an average of one pack of cigarettes a day for 1 year.  Yearly screening should continue until it has been 15 years since you quit.  Yearly screening should stop if you develop a health problem that would prevent you from having lung cancer treatment.  Breast Cancer  Practice breast self-awareness. This means understanding how your breasts normally appear and feel.  It also means doing regular breast self-exams. Let your health care provider know about any changes, no matter how small.  If you are in your 20s or 30s, you should have a  clinical breast exam (CBE) by a health care provider every 1-3 years as part of a regular health exam.  If you are 68 or older, have a CBE every year. Also consider having a breast X-ray (mammogram) every year.  If you have a family history of breast cancer, talk to your health care provider about genetic screening.  If you are at high risk for breast cancer, talk to your health care provider about having an MRI and a mammogram every year.  Breast cancer gene (BRCA) assessment is recommended for women who have family members with BRCA-related cancers. BRCA-related cancers include: ? Breast. ? Ovarian. ? Tubal. ? Peritoneal cancers.  Results of the assessment will determine the need for genetic counseling and BRCA1 and BRCA2 testing.  Cervical Cancer Your health care provider may recommend that you be screened regularly for cancer of the pelvic organs (ovaries, uterus, and vagina). This screening involves a pelvic examination, including checking for microscopic changes to the surface of your cervix (Pap test). You may be encouraged to have this screening done every 3 years, beginning at age 15.  For women ages 20-65, health care providers may recommend pelvic exams and Pap testing every 3 years, or they may recommend the Pap and pelvic exam, combined with testing for human papilloma virus (HPV), every 5 years. Some types of HPV increase your risk of cervical cancer. Testing for HPV may also be done on women of any age with unclear Pap test results.  Other health care providers may not recommend any screening for nonpregnant women who are considered low risk for pelvic cancer and who do not have symptoms. Ask your health care provider if a screening pelvic exam is right for you.  If you have had past treatment for cervical cancer or a condition that could lead to cancer, you need Pap tests and screening for cancer for at least 20 years after your treatment. If Pap tests have been discontinued,  your risk factors (such as having a new sexual partner) need to be reassessed to determine if screening should resume. Some women have medical problems that increase the chance of getting cervical cancer. In these cases, your health care provider may recommend more frequent screening and Pap tests.  Colorectal Cancer  This type of cancer can be detected and often prevented.  Routine colorectal cancer screening usually begins at 40 years of age and continues through 41 years of age.  Your health care provider may recommend screening at an earlier age if you have risk factors for colon cancer.  Your health care provider may also recommend using home test kits to check for hidden blood in the stool.  A small camera at the end of a tube can be used to examine your colon directly (sigmoidoscopy or colonoscopy). This is done to check for the earliest forms of colorectal cancer.  Routine screening usually begins at age 37.  Direct examination of the colon should be repeated every 5-10 years through 41 years of age. However, you may need to be screened more often if early forms of precancerous polyps or small growths are found.  Skin Cancer  Check your skin from head to toe regularly.  Tell your health care provider about any new moles or changes in moles, especially if there is a change in a mole's shape or color.  Also tell your health care provider if you have a mole that is larger than the size of a pencil eraser.  Always use sunscreen. Apply sunscreen liberally and repeatedly throughout the day.  Protect yourself by wearing long sleeves, pants, a wide-brimmed hat, and sunglasses whenever you are outside.  Heart disease, diabetes, and high blood pressure  High blood pressure causes heart disease and increases the risk of stroke. High blood pressure is more likely to develop in: ? People who have blood pressure in the high end of the normal range (130-139/85-89 mm Hg). ? People who are  overweight or obese. ? People who are African American.  If you are 32-56 years of age, have your blood pressure checked every 3-5 years. If you are 20 years of age or older, have your blood pressure checked every year. You should have your blood pressure measured twice-once when you are at a hospital or clinic, and once when you are not at a hospital or clinic. Record the average of the two measurements. To check your blood pressure when you are not at a hospital or clinic, you can use: ? An automated blood pressure machine at a pharmacy. ? A home blood pressure monitor.  If you are between 4 years and 87 years old, ask your health care provider if you should take aspirin to prevent strokes.  Have regular diabetes screenings. This involves taking a blood sample to check your fasting blood sugar level. ? If you are at a normal weight and have a low risk for diabetes, have this test once every three years after 41 years of age. ? If you are overweight and have a high risk for diabetes, consider being tested at a younger age or more often. Preventing infection Hepatitis B  If you have a higher risk for hepatitis B, you should be screened for this virus. You are considered at high risk for hepatitis B if: ? You were born in a country where hepatitis B is common. Ask your health care provider which countries are considered high risk. ? Your parents were born in a high-risk country, and you have not been immunized against hepatitis B (hepatitis B vaccine). ? You have HIV or AIDS. ? You use needles to inject street drugs. ? You live with someone who has hepatitis B. ? You have had sex with someone who has hepatitis B. ? You get hemodialysis treatment. ? You take certain medicines for conditions, including cancer, organ transplantation, and autoimmune conditions.  Hepatitis C  Blood testing is recommended for: ? Everyone born from [birth year] through 1965. ? Anyone with known risk factors for  hepatitis C.  Sexually transmitted infections (STIs)  You should be screened for sexually transmitted infections (STIs) including gonorrhea and chlamydia if: ? You are sexually active and are younger than 41 years of age. ? You are older than 41 years of age and your health care provider tells you that you are at risk for this type of infection. ? Your sexual activity has changed since you were last screened and  you are at an increased risk for chlamydia or gonorrhea. Ask your health care provider if you are at risk.  If you do not have HIV, but are at risk, it may be recommended that you take a prescription medicine daily to prevent HIV infection. This is called pre-exposure prophylaxis (PrEP). You are considered at risk if: ? You are sexually active and do not regularly use condoms or know the HIV status of your partner(s). ? You take drugs by injection. ? You are sexually active with a partner who has HIV.  Talk with your health care provider about whether you are at high risk of being infected with HIV. If you choose to begin PrEP, you should first be tested for HIV. You should then be tested every 3 months for as long as you are taking PrEP. Pregnancy  If you are premenopausal and you may become pregnant, ask your health care provider about preconception counseling.  If you may become pregnant, take 400 to 800 micrograms (mcg) of folic acid every day.  If you want to prevent pregnancy, talk to your health care provider about birth control (contraception). Osteoporosis and menopause  Osteoporosis is a disease in which the bones lose minerals and strength with aging. This can result in serious bone fractures. Your risk for osteoporosis can be identified using a bone density scan.  If you are 79 years of age or older, or if you are at risk for osteoporosis and fractures, ask your health care provider if you should be screened.  Ask your health care provider whether you should take a  calcium or vitamin D supplement to lower your risk for osteoporosis.  Menopause may have certain physical symptoms and risks.  Hormone replacement therapy may reduce some of these symptoms and risks. Talk to your health care provider about whether hormone replacement therapy is right for you. Follow these instructions at home:  Schedule regular health, dental, and eye exams.  Stay current with your immunizations.  Do not use any tobacco products including cigarettes, chewing tobacco, or electronic cigarettes.  If you are pregnant, do not drink alcohol.  If you are breastfeeding, limit how much and how often you drink alcohol.  Limit alcohol intake to no more than 1 drink per day for nonpregnant women. One drink equals 12 ounces of beer, 5 ounces of wine, or 1 ounces of hard liquor.  Do not use street drugs.  Do not share needles.  Ask your health care provider for help if you need support or information about quitting drugs.  Tell your health care provider if you often feel depressed.  Tell your health care provider if you have ever been abused or do not feel safe at home. This information is not intended to replace advice given to you by your health care provider. Make sure you discuss any questions you have with your health care provider. Document Released: 04/28/2011 Document Revised: 03/20/2016 Document Reviewed: 07/17/2015 Elsevier Interactive Patient Education  [employer].

## 2017-04-16 NOTE — Assessment & Plan Note (Signed)
Mammogram ordered and patient understands to schedule. Patient appointment with OB/GYN for Pap smear. Advised tetanus vaccine a local pharmacy. Breast exam performed today. Congratulated her on stopping smoking. Encouraged to start exercise program especially since stopped smoking, and with recent weight gain.

## 2017-04-16 NOTE — Progress Notes (Signed)
Pre visit review using our clinic review tool, if applicable. No additional management support is needed unless otherwise documented below in the visit note. 

## 2017-04-16 NOTE — Telephone Encounter (Signed)
Please advise 

## 2017-04-21 ENCOUNTER — Telehealth: Payer: Self-pay | Admitting: *Deleted

## 2017-04-21 DIAGNOSIS — Z23 Encounter for immunization: Secondary | ICD-10-CM

## 2017-04-21 NOTE — Telephone Encounter (Signed)
Please advise 

## 2017-04-21 NOTE — Telephone Encounter (Signed)
This can wait until Heather Fisher's return.

## 2017-04-21 NOTE — Telephone Encounter (Signed)
Pt requested to have her tetanus injection done at [facility] [address], Pt stated that she will need a script for this .  Pt contact [phone]

## 2017-04-22 ENCOUNTER — Encounter: Payer: Self-pay | Admitting: Family

## 2017-04-23 ENCOUNTER — Other Ambulatory Visit (INDEPENDENT_AMBULATORY_CARE_PROVIDER_SITE_OTHER): Payer: BLUE CROSS/BLUE SHIELD

## 2017-04-23 DIAGNOSIS — Z0000 Encounter for general adult medical examination without abnormal findings: Secondary | ICD-10-CM | POA: Diagnosis not present

## 2017-04-23 DIAGNOSIS — R140 Abdominal distension (gaseous): Secondary | ICD-10-CM | POA: Diagnosis not present

## 2017-04-23 DIAGNOSIS — Z23 Encounter for immunization: Secondary | ICD-10-CM | POA: Diagnosis not present

## 2017-04-23 LAB — COMPREHENSIVE METABOLIC PANEL
ALT: 14 U/L (ref 0–35)
AST: 15 U/L (ref 0–37)
Albumin: 4.4 g/dL (ref 3.5–5.2)
Alkaline Phosphatase: 41 U/L (ref 39–117)
BILIRUBIN TOTAL: 0.6 mg/dL (ref 0.2–1.2)
BUN: 12 mg/dL (ref 6–23)
CALCIUM: 9.9 mg/dL (ref 8.4–10.5)
CO2: 29 meq/L (ref 19–32)
Chloride: 103 mEq/L (ref 96–112)
Creatinine, Ser: 0.96 mg/dL (ref 0.40–1.20)
GFR: 67.91 mL/min (ref >60.00)
GLUCOSE: 95 mg/dL (ref 70–99)
Potassium: 3.8 mEq/L (ref 3.5–5.1)
Sodium: 139 mEq/L (ref 135–145)
Total Protein: 7.1 g/dL (ref 6.0–8.3)

## 2017-04-23 LAB — CBC WITH DIFFERENTIAL/PLATELET
Basophils Absolute: 0 10*3/uL (ref 0.0–0.1)
Basophils Relative: 0.6 % (ref 0.0–3.0)
EOS ABS: 0.1 10*3/uL (ref 0.0–0.7)
Eosinophils Relative: 1.2 % (ref 0.0–5.0)
HEMATOCRIT: 39.9 % (ref 36.0–46.0)
Hemoglobin: 13.4 g/dL (ref 12.0–15.0)
LYMPHS PCT: 22 % (ref 12.0–46.0)
Lymphs Abs: 1.5 10*3/uL (ref 0.7–4.0)
MCHC: 33.5 g/dL (ref 30.0–36.0)
MCV: 89.1 fl (ref 78.0–100.0)
Monocytes Absolute: 0.4 10*3/uL (ref 0.1–1.0)
Monocytes Relative: 5.9 % (ref 3.0–12.0)
NEUTROS ABS: 4.9 10*3/uL (ref 1.4–7.7)
Neutrophils Relative %: 70.3 % (ref 43.0–77.0)
PLATELETS: 214 10*3/uL (ref 150.0–400.0)
RBC: 4.47 Mil/uL (ref 3.87–5.11)
RDW: 12.8 % (ref 11.5–15.5)
WBC: 7 10*3/uL (ref 4.0–10.5)

## 2017-04-23 LAB — LIPID PANEL
CHOL/HDL RATIO: 3
CHOLESTEROL: 169 mg/dL (ref 0–200)
HDL: 66.4 mg/dL (ref >39.00)
LDL CALC: 94 mg/dL (ref 0–99)
NonHDL: 102.92
Triglycerides: 43 mg/dL (ref 0.0–149.0)
VLDL: 8.6 mg/dL (ref 0.0–40.0)

## 2017-04-23 LAB — TSH: TSH: 1.7 u[IU]/mL (ref 0.35–4.50)

## 2017-04-23 LAB — HEMOGLOBIN A1C: Hgb A1c MFr Bld: 5.2 % (ref 4.6–6.5)

## 2017-04-23 LAB — VITAMIN D 25 HYDROXY (VIT D DEFICIENCY, FRACTURES): VITD: 30.73 ng/mL (ref 30.00–100.00)

## 2017-04-25 LAB — CELIAC DISEASE AB SCREEN W/RFX
ANTIGLIADIN ABS, IGA: 4 U (ref 0–19)
IgA/Immunoglobulin A, Serum: 200 mg/dL (ref 87–352)
Transglutaminase IgA: <2 U/mL (ref 0–3)

## 2017-04-27 NOTE — Telephone Encounter (Signed)
Left message for patient top return call back.

## 2017-04-27 NOTE — Telephone Encounter (Signed)
Ordered  Notify pt

## 2017-04-30 NOTE — Telephone Encounter (Signed)
Patient has been notified

## 2017-05-13 ENCOUNTER — Ambulatory Visit: Payer: BLUE CROSS/BLUE SHIELD

## 2017-05-14 ENCOUNTER — Ambulatory Visit
Admission: RE | Admit: 2017-05-14 | Discharge: 2017-05-14 | Disposition: A | Discharge status: Home / Self Care | Payer: BLUE CROSS/BLUE SHIELD | Source: Ambulatory Visit | Attending: Family | Admitting: Family

## 2017-05-14 ENCOUNTER — Encounter: Payer: Self-pay | Admitting: Family

## 2017-05-14 DIAGNOSIS — Z0000 Encounter for general adult medical examination without abnormal findings: Secondary | ICD-10-CM

## 2017-05-14 DIAGNOSIS — Z1231 Encounter for screening mammogram for malignant neoplasm of breast: Secondary | ICD-10-CM | POA: Diagnosis not present

## 2017-05-15 ENCOUNTER — Encounter: Payer: BLUE CROSS/BLUE SHIELD | Attending: Family | Admitting: Dietician

## 2017-05-15 ENCOUNTER — Encounter: Payer: Self-pay | Admitting: Dietician

## 2017-05-15 VITALS — Ht 65.0 in | Wt 152.8 lb

## 2017-05-15 DIAGNOSIS — R140 Abdominal distension (gaseous): Secondary | ICD-10-CM

## 2017-05-15 NOTE — Patient Instructions (Addendum)
-   Check [place] for FODMAP diet parameters  - Continue to get in enough fiber daily (20-25g/day) and enough fluids each day, great job!  - Try to cut down on the amount of gum chewed daily. Replace with a non-caloric beverage or a healthy whole food snack!

## 2017-05-15 NOTE — Progress Notes (Signed)
Medical Nutrition Therapy: Visit start time: 1330  end time: 5456  Assessment:  Diagnosis: abdominal bloating Past medical history: Raynaud's phenomenon, palpitations, see chart Psychosocial issues/ stress concerns: none Preferred learning method:  . Auditory . Visual . Hands-on  Current weight: 152.8lb  Height: 5'5" Medications, supplements: Magnesium, Zyrtec, see chart for full listing  Progress and evaluation: Patient's main concern is persistent, occasional abdominal bloating and fluid retention. Also reports sx of constipation and gas. Has been unable to ID a pattern as far as when GI sx occur, and typically eats the same "clean" things each day with the exception of a "cheat day" every week where consumption of sweets is increased. Tries to consume 96oz of water per day.Tries to limit salt intake d/t h/o fluid retention. Does not have a high consumption of dairy products and denies GI sx when consumed, and has been tested for celiac disease with negative results. CHO intake is fairly low, consuming mostly vegetables, [place] crackers and oatmeal. States she tends to overeat at night and chews 1-2 packs of SF gum/day. BMI: 25.42- overweight. Goal wt is 130# which she reports to have been at 78yrs ago. CBW is 12.8# above goal wt. States she is often confused about what to eat d/t conflicting nutrition information being given to the public.   Physical activity: Runs 3x/wk for 20+ minutes. Future plans to start attending classes at the [facility]  Dietary Intake:  Usual eating pattern includes 2-3 meals and 2-3 snacks per day. Dining out frequency: 1-2 meals per week.  Breakfast: oatmeal with flax & chia seeds, almonds, strawberries, almond milk Snack: egg + almonds, shredded chicken/turkey + almonds, occasionally [place] yogurt Lunch: 2 eggs, celery/zuccini or wasa with hummus/ tuna/veggies Snack: may have the wasa combination if not eaten at lunch time Supper: similar to lunch Snack: "too much"  almond butter, unsalted pumpkin seeds, occasionally ice cream Beverages: 4-5 cups of coffee and/or hot tea, water  Nutrition Care Education: Topics covered: high FODMAP foods and diet parameters, dietary causes of abdominal bloating, fiber and fluid intake/ guidelines for adult women, food intolerances, addressed questions concerning conflicting nutrition information as seen in the media/online, importance of protein in the diet Basic nutrition: basic food groups, appropriate nutrient balance, appropriate meal and snack schedule, general nutrition guidelines    Other lifestyle changes:  benefits of making changes, readiness for change, identifying habits that need to change  Nutritional Diagnosis:  [place]-1.4 Altered GI function As related to dietary choices.  As evidenced by patient report of constipation, abdominal bloating, gas.  Intervention: Discussion as noted above. Patient is interested in looking up information on high FODMAP foods and is considering being more mindful of her consumption of these foods/ ingredients. Willing to try to decrease consumption of SF gum to see if that has an effect on bloating. If she decided to move forward with a true FODMAP elimination diet she plans to call back for a follow up appointment.  Education Materials given:  . General diet guidelines for FODMAP diet . Goals/ instructions  Learner/ who was taught:  . Patient  Level of understanding: Marland Kitchen Verbalizes/ demonstrates competency  Demonstrated degree of understanding via:   Teach back Learning barriers: . None  Willingness to learn/ readiness for change: . Acceptance, ready for change  Monitoring and Evaluation:  Dietary intake, exercise, abdominal bloat/ other GI symptoms, and body weight      follow up: prn

## 2017-06-01 ENCOUNTER — Telehealth: Payer: Self-pay | Admitting: Family

## 2017-06-01 NOTE — Telephone Encounter (Signed)
Letter has been mailed.

## 2017-06-01 NOTE — Telephone Encounter (Signed)
Please mail for unread mychart message-   Jnya,   The report says 2D with tomo so I assume 2D however perhaps I have misunderstood the wording on the report.   I would advise you to call and clarify if it was done.   Please let me know what you find out!   Joycelyn Schmid

## 2017-08-23 DIAGNOSIS — Z23 Encounter for immunization: Secondary | ICD-10-CM | POA: Diagnosis not present

## 2017-09-26 IMAGING — MG MM DIGITAL SCREENING BILAT W/ TOMO W/ CAD
9 of 12 series · 9 of 28 positions shown · non-contrast
Comparison: None.

CLINICAL DATA: Screening.

EXAM:
2D DIGITAL SCREENING BILATERAL MAMMOGRAM WITH CAD AND ADJUNCT TOMO

[L CC synth-2D]
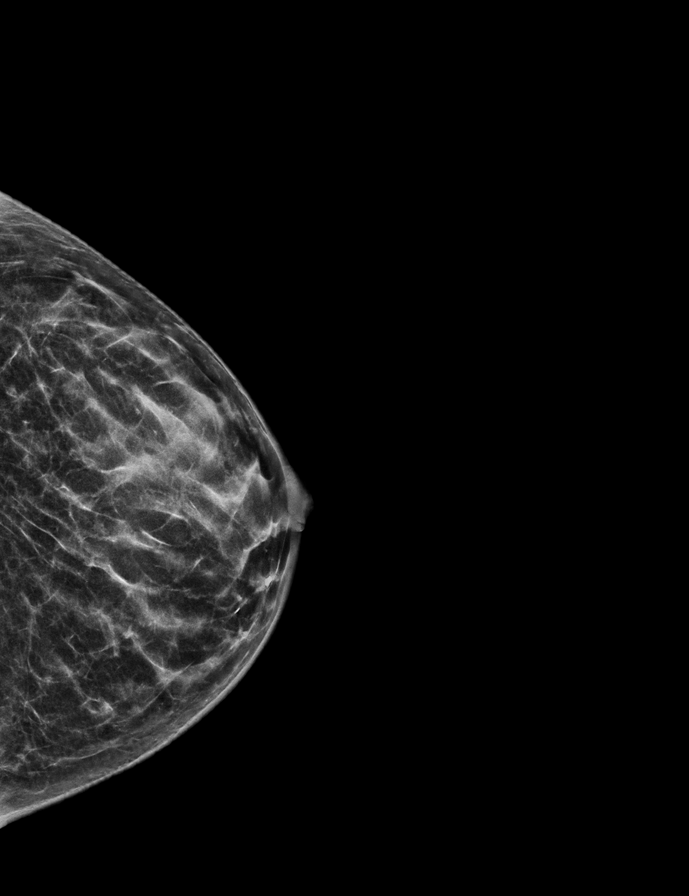

[L MLO]
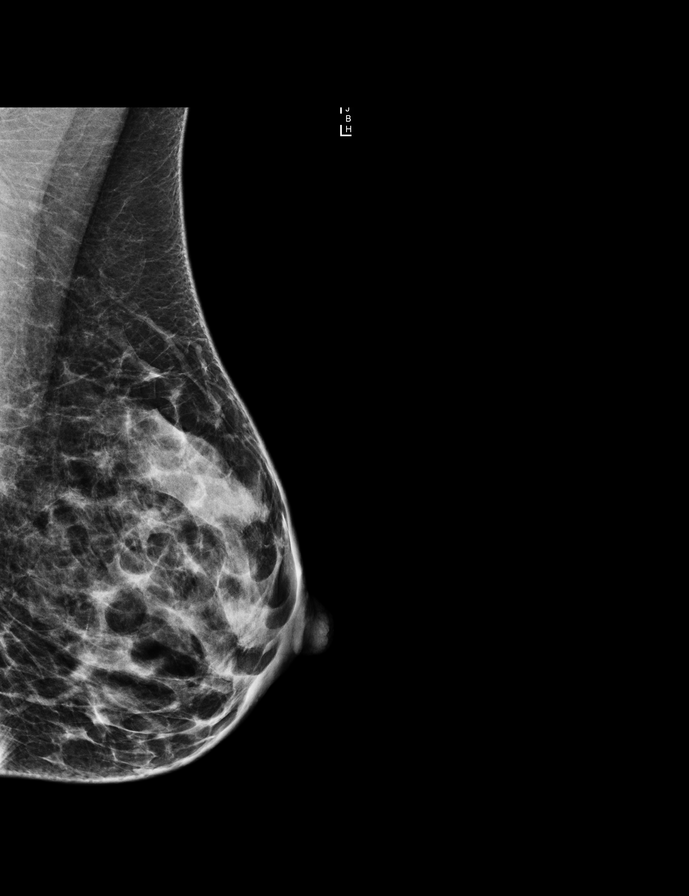

[L MLO synth-2D]
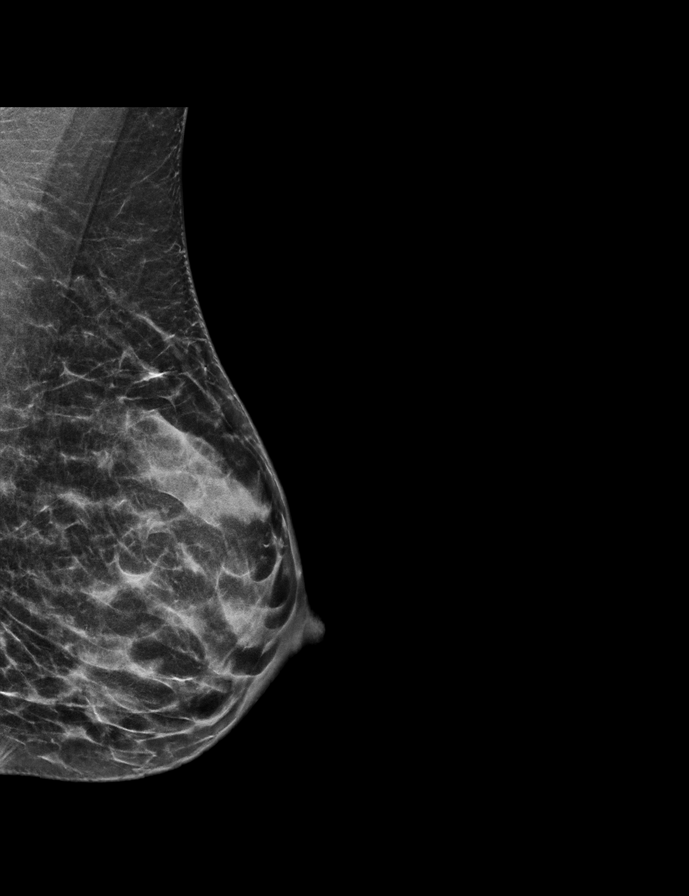

[R MLO]
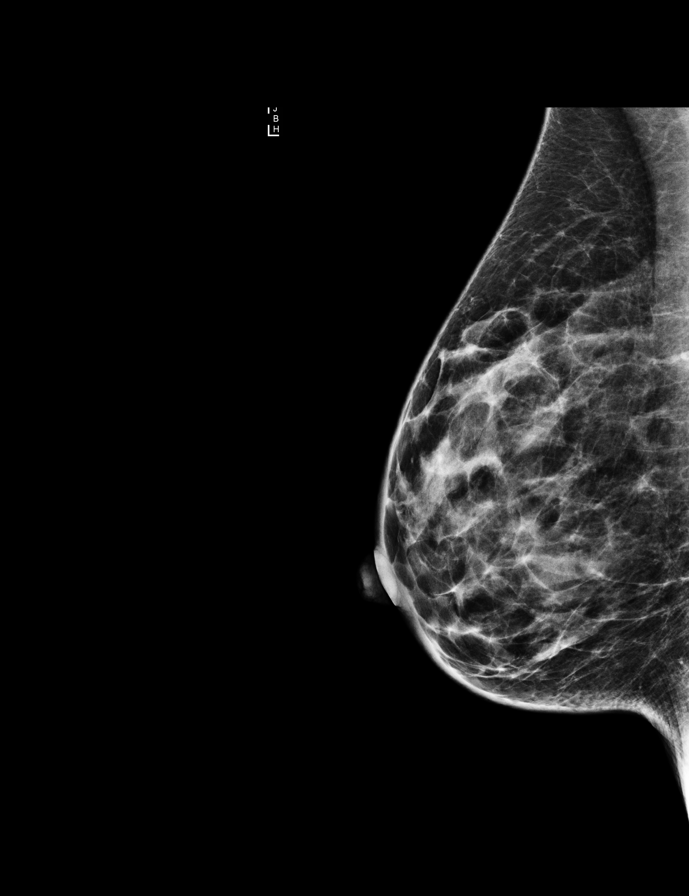

[R CC]
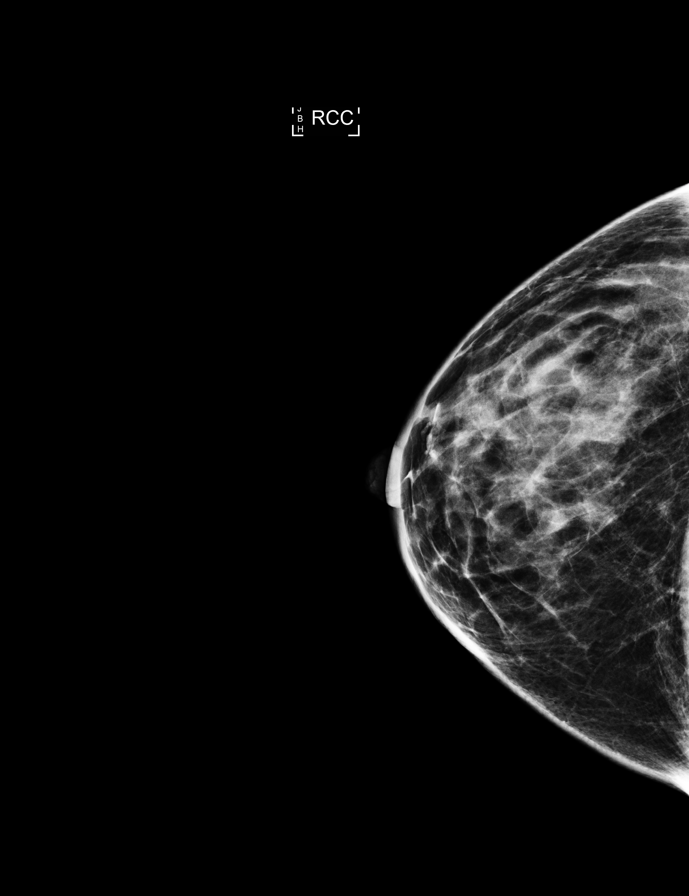

[R MLO synth-2D]
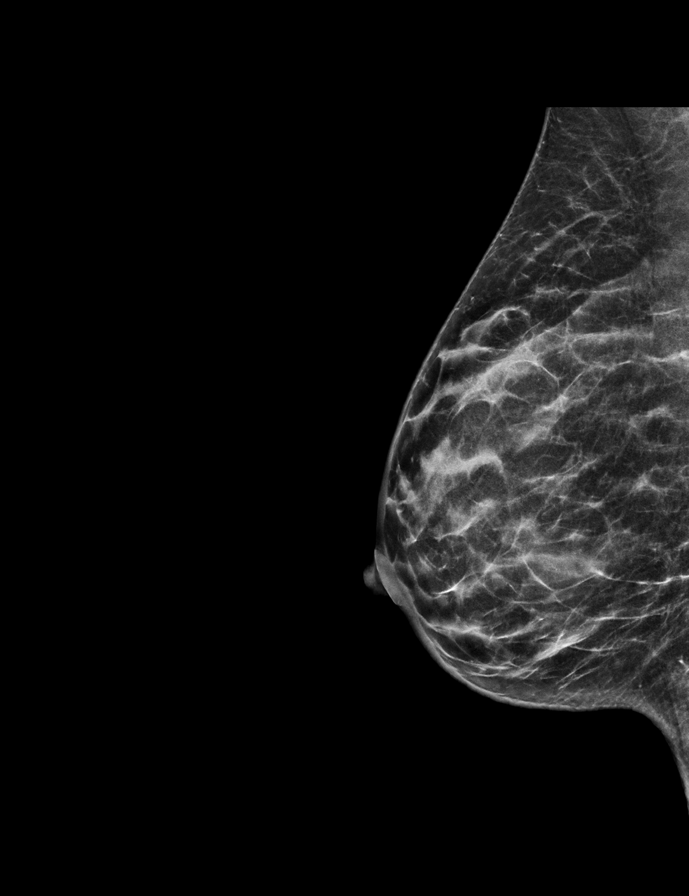

[R CC synth-2D]
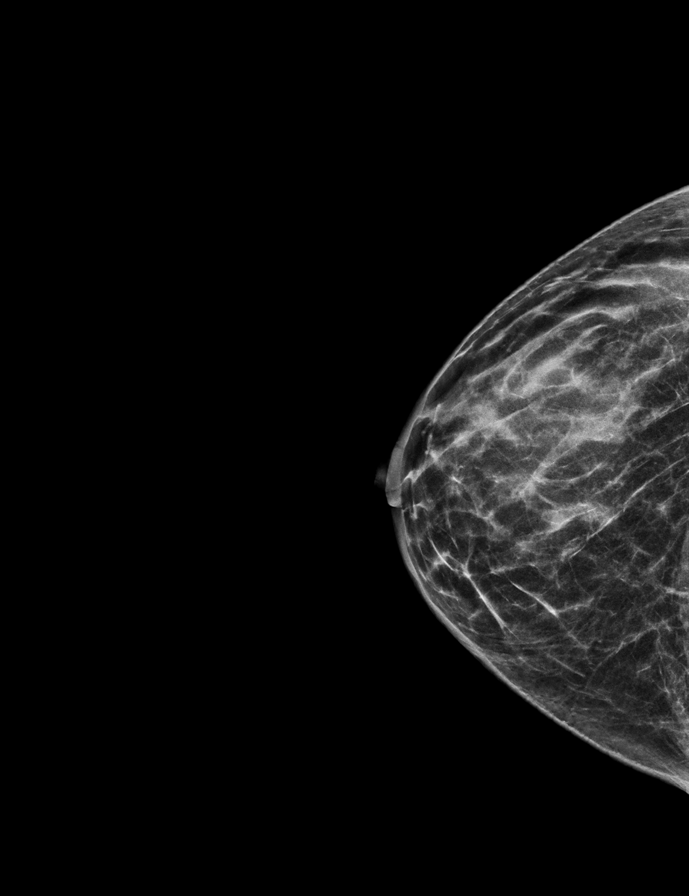

[L CC]
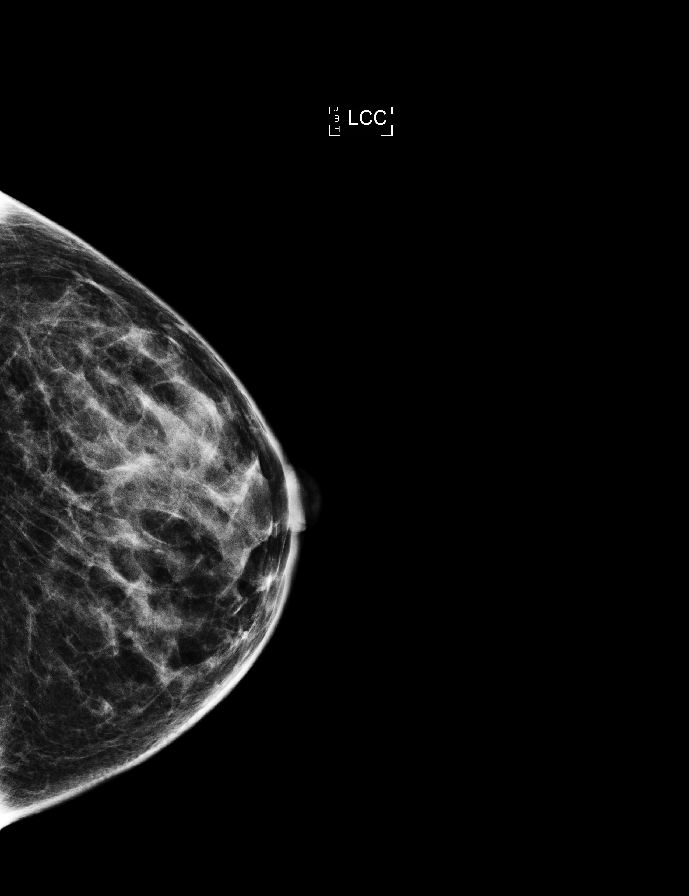

[R CC tomo · tomo slice 23/46.0]
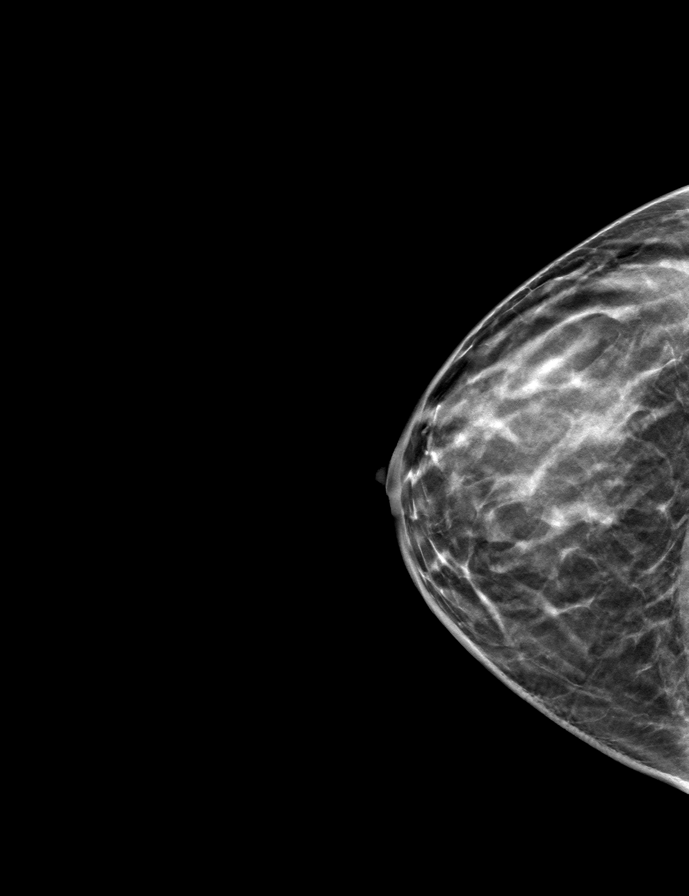

[9 of 28 positions shown; findings below may reference images not displayed]

ACR Breast Density Category c: The breast tissue is heterogeneously
dense, which may obscure small masses
FINDINGS: There are no findings suspicious for malignancy. Images were
processed with CAD.
IMPRESSION: No mammographic evidence of malignancy. A result letter of this
screening mammogram will be mailed directly to the patient.

RECOMMENDATION:
Screening mammogram in one year. (Code:[id number])

BI-RADS CATEGORY  1: Negative.

## 2017-10-09 DIAGNOSIS — J069 Acute upper respiratory infection, unspecified: Secondary | ICD-10-CM | POA: Diagnosis not present

## 2017-11-17 ENCOUNTER — Encounter: Payer: Self-pay | Admitting: Family

## 2018-04-19 ENCOUNTER — Encounter: Payer: BLUE CROSS/BLUE SHIELD | Admitting: Family

## 2018-04-21 ENCOUNTER — Ambulatory Visit (INDEPENDENT_AMBULATORY_CARE_PROVIDER_SITE_OTHER): Payer: BLUE CROSS/BLUE SHIELD | Admitting: Family

## 2018-04-21 ENCOUNTER — Encounter: Payer: Self-pay | Admitting: Family

## 2018-04-21 VITALS — BP 118/80 | HR 54 | Temp 97.7°F | Ht 64.5 in | Wt 161.0 lb

## 2018-04-21 DIAGNOSIS — Z0000 Encounter for general adult medical examination without abnormal findings: Secondary | ICD-10-CM | POA: Diagnosis not present

## 2018-04-21 DIAGNOSIS — F339 Major depressive disorder, recurrent, unspecified: Secondary | ICD-10-CM | POA: Diagnosis not present

## 2018-04-21 DIAGNOSIS — R140 Abdominal distension (gaseous): Secondary | ICD-10-CM

## 2018-04-21 DIAGNOSIS — R001 Bradycardia, unspecified: Secondary | ICD-10-CM | POA: Diagnosis not present

## 2018-04-21 DIAGNOSIS — F329 Major depressive disorder, single episode, unspecified: Secondary | ICD-10-CM | POA: Insufficient documentation

## 2018-04-21 DIAGNOSIS — F32A Depression, unspecified: Secondary | ICD-10-CM | POA: Insufficient documentation

## 2018-04-21 LAB — LIPID PANEL
CHOL/HDL RATIO: 3
Cholesterol: 204 mg/dL — ABNORMAL HIGH (ref 0–200)
HDL: 76.8 mg/dL (ref >39.00)
LDL CALC: 117 mg/dL — AB (ref 0–99)
NonHDL: 127
TRIGLYCERIDES: 50 mg/dL (ref 0.0–149.0)
VLDL: 10 mg/dL (ref 0.0–40.0)

## 2018-04-21 LAB — CBC WITH DIFFERENTIAL/PLATELET
BASOS PCT: 1 % (ref 0.0–3.0)
Basophils Absolute: 0.1 10*3/uL (ref 0.0–0.1)
EOS PCT: 5.4 % — AB (ref 0.0–5.0)
Eosinophils Absolute: 0.3 10*3/uL (ref 0.0–0.7)
HCT: 40 % (ref 36.0–46.0)
Hemoglobin: 13.6 g/dL (ref 12.0–15.0)
LYMPHS ABS: 1.5 10*3/uL (ref 0.7–4.0)
Lymphocytes Relative: 25.9 % (ref 12.0–46.0)
MCHC: 34.1 g/dL (ref 30.0–36.0)
MCV: 88.6 fl (ref 78.0–100.0)
MONO ABS: 0.4 10*3/uL (ref 0.1–1.0)
MONOS PCT: 7 % (ref 3.0–12.0)
NEUTROS ABS: 3.6 10*3/uL (ref 1.4–7.7)
NEUTROS PCT: 60.7 % (ref 43.0–77.0)
Platelets: 268 10*3/uL (ref 150.0–400.0)
RBC: 4.51 Mil/uL (ref 3.87–5.11)
RDW: 13.6 % (ref 11.5–15.5)
WBC: 5.9 10*3/uL (ref 4.0–10.5)

## 2018-04-21 LAB — COMPREHENSIVE METABOLIC PANEL
ALT: 16 U/L (ref 0–35)
AST: 19 U/L (ref 0–37)
Albumin: 4.6 g/dL (ref 3.5–5.2)
Alkaline Phosphatase: 42 U/L (ref 39–117)
BILIRUBIN TOTAL: 0.5 mg/dL (ref 0.2–1.2)
BUN: 12 mg/dL (ref 6–23)
CHLORIDE: 102 meq/L (ref 96–112)
CO2: 30 meq/L (ref 19–32)
Calcium: 9.9 mg/dL (ref 8.4–10.5)
Creatinine, Ser: 0.85 mg/dL (ref 0.40–1.20)
GFR: 77.78 mL/min (ref >60.00)
GLUCOSE: 95 mg/dL (ref 70–99)
POTASSIUM: 4.1 meq/L (ref 3.5–5.1)
SODIUM: 137 meq/L (ref 135–145)
Total Protein: 7.7 g/dL (ref 6.0–8.3)

## 2018-04-21 LAB — VITAMIN D 25 HYDROXY (VIT D DEFICIENCY, FRACTURES): VITD: 24.94 ng/mL — ABNORMAL LOW (ref 30.00–100.00)

## 2018-04-21 LAB — HEMOGLOBIN A1C: Hgb A1c MFr Bld: 5.5 % (ref 4.6–6.5)

## 2018-04-21 LAB — TSH: TSH: 1.7 u[IU]/mL (ref 0.35–4.50)

## 2018-04-21 NOTE — Assessment & Plan Note (Addendum)
Worsened over past 2 years. Declines medication, counseling today. Offered zoloft as she had been on in past. She will let me know how she is doing.

## 2018-04-21 NOTE — Patient Instructions (Addendum)
Let me know if burping , burning worsens or new symptoms present; we would need to consult GI  Normal heart rate is 60-99 beats per minute.   If persistent dizziness, lightheadedness, palpitations, let me know as we should consult Dr Fletcher Anon for your chronic low resting heart rate.   We placed a referral for mammogram this year. I asked that you call one the below locations and schedule this when it is convenient for you.   As discussed, I would like you to ask for 3D mammogram over the traditional 2D mammogram as new evidence suggest 3D is superior.   Please note that NOT all insurance companies cover 3D and you may have to pay a higher copay. You may call your insurance company to further clarify your benefits.   Options for [place]  [place], [place]  * [facility] [place], [place] * Note if you ask for 3D mammogram at this location, you must request [place], [place] location*      Let me know how your mood is and if you need anything from me  Labs  Health Maintenance, Female Adopting a healthy lifestyle and getting preventive care can go a long way to promote health and wellness. Talk with your health care provider about what schedule of regular examinations is right for you. This is a good chance for you to check in with your provider about disease prevention and staying healthy. In between checkups, there are plenty of things you can do on your own. Experts have done a lot of research about which lifestyle changes and preventive measures are most likely to keep you healthy. Ask your health care provider for more information. Weight and diet Eat a healthy diet  Be sure to include plenty of vegetables, fruits, low-fat dairy products, and lean protein.  Do not eat a lot of foods high in solid fats, added sugars, or  salt.  Get regular exercise. This is one of the most important things you can do for your health. ? Most adults should exercise for at least 150 minutes each week. The exercise should increase your heart rate and make you sweat (moderate-intensity exercise). ? Most adults should also do strengthening exercises at least twice a week. This is in addition to the moderate-intensity exercise.  Maintain a healthy weight  Body mass index (BMI) is a measurement that can be used to identify possible weight problems. It estimates body fat based on height and weight. Your health care provider can help determine your BMI and help you achieve or maintain a healthy weight.  For females 27 years of age and older: ? A BMI below 18.5 is considered underweight. ? A BMI of 18.5 to 24.9 is normal. ? A BMI of 25 to 29.9 is considered overweight. ? A BMI of 30 and above is considered obese.  Watch levels of cholesterol and blood lipids  You should start having your blood tested for lipids and cholesterol at 42 years of age, then have this test every 5 years.  You may need to have your cholesterol levels checked more often if: ? Your lipid or cholesterol levels are high. ? You are older than 42 years of age. ? You are at high risk for heart disease.  Cancer screening Lung Cancer  Lung cancer screening is recommended for adults 5-62 years old who  are at high risk for lung cancer because of a history of smoking.  A yearly low-dose CT scan of the lungs is recommended for people who: ? Currently smoke. ? Have quit within the past 15 years. ? Have at least a 30-pack-year history of smoking. A pack year is smoking an average of one pack of cigarettes a day for 1 year.  Yearly screening should continue until it has been 15 years since you quit.  Yearly screening should stop if you develop a health problem that would prevent you from having lung cancer treatment.  Breast Cancer  Practice breast  self-awareness. This means understanding how your breasts normally appear and feel.  It also means doing regular breast self-exams. Let your health care provider know about any changes, no matter how small.  If you are in your 20s or 30s, you should have a clinical breast exam (CBE) by a health care provider every 1-3 years as part of a regular health exam.  If you are 27 or older, have a CBE every year. Also consider having a breast X-ray (mammogram) every year.  If you have a family history of breast cancer, talk to your health care provider about genetic screening.  If you are at high risk for breast cancer, talk to your health care provider about having an MRI and a mammogram every year.  Breast cancer gene (BRCA) assessment is recommended for women who have family members with BRCA-related cancers. BRCA-related cancers include: ? Breast. ? Ovarian. ? Tubal. ? Peritoneal cancers.  Results of the assessment will determine the need for genetic counseling and BRCA1 and BRCA2 testing.  Cervical Cancer Your health care provider may recommend that you be screened regularly for cancer of the pelvic organs (ovaries, uterus, and vagina). This screening involves a pelvic examination, including checking for microscopic changes to the surface of your cervix (Pap test). You may be encouraged to have this screening done every 3 years, beginning at age 44.  For women ages 67-65, health care providers may recommend pelvic exams and Pap testing every 3 years, or they may recommend the Pap and pelvic exam, combined with testing for human papilloma virus (HPV), every 5 years. Some types of HPV increase your risk of cervical cancer. Testing for HPV may also be done on women of any age with unclear Pap test results.  Other health care providers may not recommend any screening for nonpregnant women who are considered low risk for pelvic cancer and who do not have symptoms. Ask your health care provider if a  screening pelvic exam is right for you.  If you have had past treatment for cervical cancer or a condition that could lead to cancer, you need Pap tests and screening for cancer for at least 20 years after your treatment. If Pap tests have been discontinued, your risk factors (such as having a new sexual partner) need to be reassessed to determine if screening should resume. Some women have medical problems that increase the chance of getting cervical cancer. In these cases, your health care provider may recommend more frequent screening and Pap tests.  Colorectal Cancer  This type of cancer can be detected and often prevented.  Routine colorectal cancer screening usually begins at 42 years of age and continues through 42 years of age.  Your health care provider may recommend screening at an earlier age if you have risk factors for colon cancer.  Your health care provider may also recommend using home test kits to  check for hidden blood in the stool.  A small camera at the end of a tube can be used to examine your colon directly (sigmoidoscopy or colonoscopy). This is done to check for the earliest forms of colorectal cancer.  Routine screening usually begins at age 53.  Direct examination of the colon should be repeated every 5-10 years through 42 years of age. However, you may need to be screened more often if early forms of precancerous polyps or small growths are found.  Skin Cancer  Check your skin from head to toe regularly.  Tell your health care provider about any new moles or changes in moles, especially if there is a change in a mole's shape or color.  Also tell your health care provider if you have a mole that is larger than the size of a pencil eraser.  Always use sunscreen. Apply sunscreen liberally and repeatedly throughout the day.  Protect yourself by wearing long sleeves, pants, a wide-brimmed hat, and sunglasses whenever you are outside.  Heart disease, diabetes, and  high blood pressure  High blood pressure causes heart disease and increases the risk of stroke. High blood pressure is more likely to develop in: ? People who have blood pressure in the high end of the normal range (130-139/85-89 mm Hg). ? People who are overweight or obese. ? People who are African American.  If you are 14-73 years of age, have your blood pressure checked every 3-5 years. If you are 8 years of age or older, have your blood pressure checked every year. You should have your blood pressure measured twice-once when you are at a hospital or clinic, and once when you are not at a hospital or clinic. Record the average of the two measurements. To check your blood pressure when you are not at a hospital or clinic, you can use: ? An automated blood pressure machine at a pharmacy. ? A home blood pressure monitor.  If you are between 13 years and 75 years old, ask your health care provider if you should take aspirin to prevent strokes.  Have regular diabetes screenings. This involves taking a blood sample to check your fasting blood sugar level. ? If you are at a normal weight and have a low risk for diabetes, have this test once every three years after 42 years of age. ? If you are overweight and have a high risk for diabetes, consider being tested at a younger age or more often. Preventing infection Hepatitis B  If you have a higher risk for hepatitis B, you should be screened for this virus. You are considered at high risk for hepatitis B if: ? You were born in a country where hepatitis B is common. Ask your health care provider which countries are considered high risk. ? Your parents were born in a high-risk country, and you have not been immunized against hepatitis B (hepatitis B vaccine). ? You have HIV or AIDS. ? You use needles to inject street drugs. ? You live with someone who has hepatitis B. ? You have had sex with someone who has hepatitis B. ? You get hemodialysis  treatment. ? You take certain medicines for conditions, including cancer, organ transplantation, and autoimmune conditions.  Hepatitis C  Blood testing is recommended for: ? Everyone born from [birth year] through 1965. ? Anyone with known risk factors for hepatitis C.  Sexually transmitted infections (STIs)  You should be screened for sexually transmitted infections (STIs) including gonorrhea and chlamydia if: ? You  are sexually active and are younger than 42 years of age. ? You are older than 42 years of age and your health care provider tells you that you are at risk for this type of infection. ? Your sexual activity has changed since you were last screened and you are at an increased risk for chlamydia or gonorrhea. Ask your health care provider if you are at risk.  If you do not have HIV, but are at risk, it may be recommended that you take a prescription medicine daily to prevent HIV infection. This is called pre-exposure prophylaxis (PrEP). You are considered at risk if: ? You are sexually active and do not regularly use condoms or know the HIV status of your partner(s). ? You take drugs by injection. ? You are sexually active with a partner who has HIV.  Talk with your health care provider about whether you are at high risk of being infected with HIV. If you choose to begin PrEP, you should first be tested for HIV. You should then be tested every 3 months for as long as you are taking PrEP. Pregnancy  If you are premenopausal and you may become pregnant, ask your health care provider about preconception counseling.  If you may become pregnant, take 400 to 800 micrograms (mcg) of folic acid every day.  If you want to prevent pregnancy, talk to your health care provider about birth control (contraception). Osteoporosis and menopause  Osteoporosis is a disease in which the bones lose minerals and strength with aging. This can result in serious bone fractures. Your risk for osteoporosis  can be identified using a bone density scan.  If you are 48 years of age or older, or if you are at risk for osteoporosis and fractures, ask your health care provider if you should be screened.  Ask your health care provider whether you should take a calcium or vitamin D supplement to lower your risk for osteoporosis.  Menopause may have certain physical symptoms and risks.  Hormone replacement therapy may reduce some of these symptoms and risks. Talk to your health care provider about whether hormone replacement therapy is right for you. Follow these instructions at home:  Schedule regular health, dental, and eye exams.  Stay current with your immunizations.  Do not use any tobacco products including cigarettes, chewing tobacco, or electronic cigarettes.  If you are pregnant, do not drink alcohol.  If you are breastfeeding, limit how much and how often you drink alcohol.  Limit alcohol intake to no more than 1 drink per day for nonpregnant women. One drink equals 12 ounces of beer, 5 ounces of wine, or 1 ounces of hard liquor.  Do not use street drugs.  Do not share needles.  Ask your health care provider for help if you need support or information about quitting drugs.  Tell your health care provider if you often feel depressed.  Tell your health care provider if you have ever been abused or do not feel safe at home. This information is not intended to replace advice given to you by your health care provider. Make sure you discuss any questions you have with your health care provider. Document Released: 04/28/2011 Document Revised: 03/20/2016 Document Reviewed: 07/17/2015 Elsevier Interactive Patient Education  [employer].

## 2018-04-21 NOTE — Assessment & Plan Note (Addendum)
CBE performed. Deferred pelvic exam in abscence of complaints; she follows with OB GYN and will make a follow up. Mammogram ordered and patient will scheduled.

## 2018-04-21 NOTE — Assessment & Plan Note (Signed)
Unchanged, improves with over-the-counter Zantac, Tums, Gas-X.  Discussed with patient may be result of her very healthy diet.  Also considering acid reflux. Pending h pylori. If normal, patient will continue to stay vigilant and let me know of any new or worsening symptoms.

## 2018-04-21 NOTE — Assessment & Plan Note (Signed)
Chronic. Asymptomatic.  Reviewed records and appears unchanged since cardiology work-up with Dr. Fletcher Anon over 2 years ago.  Patient had a long discussion regarding this, as she is been an athlete growing up and continues exercise, we jointly agreed likely benign finding.  She has no family history or personal history of arrhythmia, SCD.  If she becomes symptomatic, patient and I jointly agreed she would let me know and we will consult Dr. Fletcher Anon again.

## 2018-04-21 NOTE — Progress Notes (Signed)
Subjective:    Patient ID: Heather Fisher, female    DOB: Oct 20, 1976, 42 y.o.   MRN: [id number]  CC: Heather Fisher is a 42 y.o. female who presents today for physical exam.    HPI: Occasionally has dyspepsia, excessive gas. This has not worsened.  Takes tums and gas x from time to time with relief. Trigger foods cherry tomatoes, cauliflower, pizza. No fever, changes in weight, abdominal pain.   HR runs in 50's on fit bit. Played [employer] growing up. Occasionally dizzy when stands to quickly.   No personal or family h/o SCD, arrhythmia.  Denies exertional chest pain or pressure, numbness or tingling radiating to left arm or jaw, palpitations, syncope,  frequent headaches, changes in vision, or shortness of breath.   Irritable with menses, had been on zoloft in past. past 2 years, went back to work so' its been a lot. ' Some anxiety. NO si/hi    Echo 2016 normal.   Colorectal Cancer Screening: no early family.  Breast Cancer Screening: Mammogram due Cervical Cancer Screening: Due; on menses today; Follows with GYN, [facility].  Bone Health screening/DEXA for 65+: No increased fracture risk. Defer screening at this time. Lung Cancer Screening: Doesn't have 30 year pack year history and age > 26 years.       Tetanus - utd         Labs: Screening labs today. Exercise: Gets regular exercise.  Alcohol use: rarely Smoking/tobacco use: Nonsmoker.  Regular dental exams: UTD Wears seat belt: Yes. Skin: follows with [facility]; no new lesions   HISTORY:  Past Medical History:  Diagnosis Date  . Asthma   . Chicken pox   . Clotting disorder ([place])    Factor 5 [place]  . Hay fever   . Heart murmur    functional  . Scoliosis     Past Surgical History:  Procedure Laterality Date  . CESAREAN SECTION     2004. 2008  . tonsil    . WISDOM TOOTH EXTRACTION Bilateral    Family History  Problem Relation Age of Onset  . Arthritis Mother   . Skin cancer Mother   . Arthritis Maternal  Grandmother   . Heart disease Maternal Grandmother   . Hypertension Maternal Grandmother   . Cancer Maternal Grandmother 71       breast  . Breast cancer Maternal Grandmother 65  . Heart disease Paternal Grandmother   . Diabetes Paternal Grandmother   . Heart murmur Father   . Colon cancer Neg Hx       ALLERGIES: Penicillins and Sulfonamide derivatives  Current Outpatient Medications on File Prior to Visit  Medication Sig Dispense Refill  . cetirizine (ZYRTEC) 10 MG tablet Take 10 mg by mouth daily.    Marland [employer] Specialty Vitamins Products (MAGNESIUM, AMINO ACID CHELATE,) 133 MG tablet Take 1 tablet by mouth 2 (two) times daily.     No current facility-administered medications on file prior to visit.     Social History   Tobacco Use  . Smoking status: Former Smoker    Packs/day: 0.25    Types: Cigarettes    Last attempt to quit: 04/20/2016    Years since quitting: 2.0  . Smokeless tobacco: Never Used  . Tobacco comment: Smokes a couple per day.  Substance Use Topics  . Alcohol use: No    Alcohol/week: 0.0 oz    Comment: Rarely   . Drug use: No    Review of Systems  Constitutional: Negative  for chills, fever and unexpected weight change.  HENT: Negative for congestion.   Respiratory: Negative for cough.   Cardiovascular: Negative for chest pain, palpitations and leg swelling.  Gastrointestinal: Negative for abdominal distention, abdominal pain, constipation, diarrhea, nausea and vomiting.  Genitourinary: Negative for dysuria, pelvic pain and vaginal bleeding.  Musculoskeletal: Negative for arthralgias and myalgias.  Skin: Negative for rash.  Neurological: Negative for headaches.  Hematological: Negative for adenopathy.  Psychiatric/Behavioral: Negative for confusion and suicidal ideas. The patient is nervous/anxious.       Objective:    BP 118/80 (BP Location: Left Arm, Patient Position: Sitting, Cuff Size: Normal)   Pulse (!) 54   Temp 97.7 F (36.5 C) (Oral)   Ht  5' 4.5" (1.638 m)   Wt 161 lb (73 kg)   LMP 04/16/2018   SpO2 96%   BMI 27.21 kg/m   BP Readings from Last 3 Encounters:  04/21/18 118/80  04/16/17 100/60  09/05/16 115/86   Wt Readings from Last 3 Encounters:  04/21/18 161 lb (73 kg)  05/15/17 152 lb 12.8 oz (69.3 kg)  04/16/17 155 lb 12.8 oz (70.7 kg)    Physical Exam  Constitutional: She appears well-developed and well-nourished.  Eyes: Conjunctivae are normal.  Neck: No thyroid mass and no thyromegaly present.  Cardiovascular: Normal rate, regular rhythm, normal heart sounds and normal pulses.  Pulmonary/Chest: Effort normal and breath sounds normal. She has no wheezes. She has no rhonchi. She has no rales. Right breast exhibits no inverted nipple, no mass, no nipple discharge, no skin change and no tenderness. Left breast exhibits no inverted nipple, no mass, no nipple discharge, no skin change and no tenderness. Breasts are symmetrical.  CBE performed.   Abdominal: Soft. Normal appearance and bowel sounds are normal. She exhibits no distension, no fluid wave, no ascites and no mass. There is no tenderness. There is no rigidity, no rebound, no guarding and no CVA tenderness.  Lymphadenopathy:       Head (right side): No submental, no submandibular, no tonsillar, no preauricular, no posterior auricular and no occipital adenopathy present.       Head (left side): No submental, no submandibular, no tonsillar, no preauricular, no posterior auricular and no occipital adenopathy present.    She has no cervical adenopathy.       Right cervical: No superficial cervical, no deep cervical and no posterior cervical adenopathy present.      Left cervical: No superficial cervical, no deep cervical and no posterior cervical adenopathy present.    She has no axillary adenopathy.  Neurological: She is alert.  Skin: Skin is warm and dry.  Psychiatric: She has a normal mood and affect. Her speech is normal and behavior is normal. Thought  content normal.  Vitals reviewed.      Assessment & Plan:   Problem List Items Addressed This Visit      Other   Routine physical examination - Primary    CBE performed. Deferred pelvic exam in abscence of complaints; she follows with OB GYN and will make a follow up. Mammogram ordered and patient will scheduled.       Relevant Orders   CBC with Differential/Platelet   Comprehensive metabolic panel   Hemoglobin A1c   Lipid panel   TSH   VITAMIN D 25 Hydroxy (Vit-D Deficiency, Fractures)   MM 3D SCREEN BREAST BILATERAL   Abdominal bloating    Unchanged, improves with over-the-counter Zantac, Tums, Gas-X.  Discussed with patient may be result  of her very healthy diet.  Also considering acid reflux. Pending h pylori. If normal, patient will continue to stay vigilant and let me know of any new or worsening symptoms.        Relevant Orders   H. pylori breath test   Bradycardia    Chronic. Asymptomatic.  Reviewed records and appears unchanged since cardiology work-up with Dr. Fletcher Anon over 2 years ago.  Patient had a long discussion regarding this, as she is been an athlete growing up and continues exercise, we jointly agreed likely benign finding.  She has no family history or personal history of arrhythmia, SCD.  If she becomes symptomatic, patient and I jointly agreed she would let me know and we will consult Dr. Fletcher Anon again.      Depression, recurrent ([place])    Worsened over past 2 years. Declines medication, counseling today. Offered zoloft as she had been on in past. She will let me know how she is doing.          I am having Kambry E. Bezio maintain her magnesium (amino acid chelate) and cetirizine.   No orders of the defined types were placed in this encounter.   Return precautions given.   Risks, benefits, and alternatives of the medications and treatment plan prescribed today were discussed, and patient expressed understanding.   Education regarding symptom  management and diagnosis given to patient on AVS.   Continue to follow with Burnard Hawthorne, FNP for routine health maintenance.   [place] Se[place] and I agreed with plan.   Mable Paris, FNP

## 2018-04-22 LAB — H. PYLORI BREATH TEST: H. PYLORI BREATH TEST: NOT DETECTED

## 2018-05-27 ENCOUNTER — Ambulatory Visit
Admission: RE | Admit: 2018-05-27 | Discharge: 2018-05-27 | Disposition: A | Discharge status: Home / Self Care | Payer: BLUE CROSS/BLUE SHIELD | Source: Ambulatory Visit | Attending: Family | Admitting: Family

## 2018-05-27 DIAGNOSIS — Z1231 Encounter for screening mammogram for malignant neoplasm of breast: Secondary | ICD-10-CM | POA: Diagnosis not present

## 2018-05-27 DIAGNOSIS — Z0000 Encounter for general adult medical examination without abnormal findings: Secondary | ICD-10-CM | POA: Insufficient documentation

## 2018-08-08 DIAGNOSIS — Z23 Encounter for immunization: Secondary | ICD-10-CM | POA: Diagnosis not present

## 2019-04-20 ENCOUNTER — Other Ambulatory Visit: Payer: Self-pay

## 2019-04-25 ENCOUNTER — Encounter: Payer: BC Managed Care – PPO | Admitting: Family

## 2019-04-27 ENCOUNTER — Other Ambulatory Visit: Payer: Self-pay

## 2019-05-06 ENCOUNTER — Encounter: Payer: Self-pay | Admitting: Family

## 2019-05-06 ENCOUNTER — Telehealth: Payer: Self-pay | Admitting: Family

## 2019-05-06 ENCOUNTER — Other Ambulatory Visit (HOSPITAL_COMMUNITY)
Admission: RE | Admit: 2019-05-06 | Discharge: 2019-05-06 | Disposition: A | Discharge status: Home / Self Care | Payer: BC Managed Care – PPO | Source: Ambulatory Visit | Attending: Family | Admitting: Family

## 2019-05-06 ENCOUNTER — Ambulatory Visit (INDEPENDENT_AMBULATORY_CARE_PROVIDER_SITE_OTHER): Payer: BC Managed Care – PPO | Admitting: Family

## 2019-05-06 ENCOUNTER — Ambulatory Visit (INDEPENDENT_AMBULATORY_CARE_PROVIDER_SITE_OTHER): Payer: BC Managed Care – PPO

## 2019-05-06 ENCOUNTER — Other Ambulatory Visit: Payer: Self-pay

## 2019-05-06 VITALS — BP 88/52 | HR 58 | Temp 98.1°F | Ht 64.5 in | Wt 164.2 lb

## 2019-05-06 DIAGNOSIS — Z0000 Encounter for general adult medical examination without abnormal findings: Secondary | ICD-10-CM | POA: Diagnosis not present

## 2019-05-06 DIAGNOSIS — R87610 Atypical squamous cells of undetermined significance on cytologic smear of cervix (ASC-US): Secondary | ICD-10-CM | POA: Insufficient documentation

## 2019-05-06 DIAGNOSIS — Z0001 Encounter for general adult medical examination with abnormal findings: Secondary | ICD-10-CM | POA: Insufficient documentation

## 2019-05-06 DIAGNOSIS — Z1151 Encounter for screening for human papillomavirus (HPV): Secondary | ICD-10-CM | POA: Insufficient documentation

## 2019-05-06 DIAGNOSIS — R001 Bradycardia, unspecified: Secondary | ICD-10-CM

## 2019-05-06 DIAGNOSIS — F339 Major depressive disorder, recurrent, unspecified: Secondary | ICD-10-CM | POA: Diagnosis not present

## 2019-05-06 DIAGNOSIS — R0602 Shortness of breath: Secondary | ICD-10-CM | POA: Diagnosis not present

## 2019-05-06 LAB — COMPREHENSIVE METABOLIC PANEL
ALT: 15 U/L (ref 0–35)
AST: 16 U/L (ref 0–37)
Albumin: 4.8 g/dL (ref 3.5–5.2)
Alkaline Phosphatase: 45 U/L (ref 39–117)
BUN: 15 mg/dL (ref 6–23)
CO2: 28 mEq/L (ref 19–32)
Calcium: 9.6 mg/dL (ref 8.4–10.5)
Chloride: 103 mEq/L (ref 96–112)
Creatinine, Ser: 0.94 mg/dL (ref 0.40–1.20)
GFR: 64.83 mL/min (ref >60.00)
Glucose, Bld: 89 mg/dL (ref 70–99)
Potassium: 4.1 mEq/L (ref 3.5–5.1)
Sodium: 138 mEq/L (ref 135–145)
Total Bilirubin: 0.8 mg/dL (ref 0.2–1.2)
Total Protein: 7.4 g/dL (ref 6.0–8.3)

## 2019-05-06 LAB — CBC WITH DIFFERENTIAL/PLATELET
Basophils Absolute: 0 10*3/uL (ref 0.0–0.1)
Basophils Relative: 0.8 % (ref 0.0–3.0)
Eosinophils Absolute: 0.2 10*3/uL (ref 0.0–0.7)
Eosinophils Relative: 2.6 % (ref 0.0–5.0)
HCT: 40 % (ref 36.0–46.0)
Hemoglobin: 13.3 g/dL (ref 12.0–15.0)
Lymphocytes Relative: 32.6 % (ref 12.0–46.0)
Lymphs Abs: 1.9 10*3/uL (ref 0.7–4.0)
MCHC: 33.2 g/dL (ref 30.0–36.0)
MCV: 90 fl (ref 78.0–100.0)
Monocytes Absolute: 0.4 10*3/uL (ref 0.1–1.0)
Monocytes Relative: 6.6 % (ref 3.0–12.0)
Neutro Abs: 3.3 10*3/uL (ref 1.4–7.7)
Neutrophils Relative %: 57.4 % (ref 43.0–77.0)
Platelets: 249 10*3/uL (ref 150.0–400.0)
RBC: 4.45 Mil/uL (ref 3.87–5.11)
RDW: 13.4 % (ref 11.5–15.5)
WBC: 5.8 10*3/uL (ref 4.0–10.5)

## 2019-05-06 LAB — HEMOGLOBIN A1C: Hgb A1c MFr Bld: 5.4 % (ref 4.6–6.5)

## 2019-05-06 LAB — LIPID PANEL
Cholesterol: 190 mg/dL (ref 0–200)
HDL: 71.3 mg/dL (ref >39.00)
LDL Cholesterol: 106 mg/dL — ABNORMAL HIGH (ref 0–99)
NonHDL: 118.59
Total CHOL/HDL Ratio: 3
Triglycerides: 62 mg/dL (ref 0.0–149.0)
VLDL: 12.4 mg/dL (ref 0.0–40.0)

## 2019-05-06 LAB — VITAMIN D 25 HYDROXY (VIT D DEFICIENCY, FRACTURES): VITD: 30.53 ng/mL (ref 30.00–100.00)

## 2019-05-06 LAB — TSH: TSH: 2.24 u[IU]/mL (ref 0.35–4.50)

## 2019-05-06 MED ORDER — SERTRALINE HCL 50 MG PO TABS: 50.0000 mg | ORAL_TABLET | Freq: Every day | ORAL | 3 refills | Status: DC

## 2019-05-06 NOTE — Assessment & Plan Note (Signed)
Trial of Zoloft.  Close follow-up

## 2019-05-06 NOTE — Telephone Encounter (Signed)
I have called 7 advised patient on below. I also scheduled 6 week f/u.

## 2019-05-06 NOTE — Assessment & Plan Note (Signed)
CBE and pap performed today.  Ordered mammogram and patient will schedule.

## 2019-05-06 NOTE — Patient Instructions (Addendum)
Trial of zoloft.   Let me know how you feel. If your shortness of breath doesn't resolve on the zoloft, please let me know .  I also sent a note Dr. Fletcher Anon in regard to your EKG, symptoms today to ensure that he feels that you do not need to see him.  We placed a referral for mammogram this year. I asked that you call one the below locations and schedule this when it is convenient for you.   As discussed, I would like you to ask for 3D mammogram over the traditional 2D mammogram as new evidence suggest 3D is superior.    [place]  [place], [place]    Health Maintenance, Female Adopting a healthy lifestyle and getting preventive care are important in promoting health and wellness. Ask your health care provider about:  The right schedule for you to have regular tests and exams.  Things you can do on your own to prevent diseases and keep yourself healthy. What should I know about diet, weight, and exercise? Eat a healthy diet   Eat a diet that includes plenty of vegetables, fruits, low-fat dairy products, and lean protein.  Do not eat a lot of foods that are high in solid fats, added sugars, or sodium. Maintain a healthy weight Body mass index (BMI) is used to identify weight problems. It estimates body fat based on height and weight. Your health care provider can help determine your BMI and help you achieve or maintain a healthy weight. Get regular exercise Get regular exercise. This is one of the most important things you can do for your health. Most adults should:  Exercise for at least 150 minutes each week. The exercise should increase your heart rate and make you sweat (moderate-intensity exercise).  Do strengthening exercises at least twice a week. This is in addition to the moderate-intensity exercise.  Spend less time sitting. Even light physical activity can be beneficial. Watch cholesterol and blood lipids Have  your blood tested for lipids and cholesterol at 43 years of age, then have this test every 5 years. Have your cholesterol levels checked more often if:  Your lipid or cholesterol levels are high.  You are older than 43 years of age.  You are at high risk for heart disease. What should I know about cancer screening? Depending on your health history and family history, you may need to have cancer screening at various ages. This may include screening for:  Breast cancer.  Cervical cancer.  Colorectal cancer.  Skin cancer.  Lung cancer. What should I know about heart disease, diabetes, and high blood pressure? Blood pressure and heart disease  High blood pressure causes heart disease and increases the risk of stroke. This is more likely to develop in people who have high blood pressure readings, are of African descent, or are overweight.  Have your blood pressure checked: ? Every 3-5 years if you are 69-93 years of age. ? Every year if you are 63 years old or older. Diabetes Have regular diabetes screenings. This checks your fasting blood sugar level. Have the screening done:  Once every three years after age 59 if you are at a normal weight and have a low risk for diabetes.  More often and at a younger age if you are overweight or have a high risk for diabetes. What should I know about preventing infection? Hepatitis B If you have a higher risk for hepatitis B, you  should be screened for this virus. Talk with your health care provider to find out if you are at risk for hepatitis B infection. Hepatitis C Testing is recommended for:  Everyone born from [birth year] through 1965.  Anyone with known risk factors for hepatitis C. Sexually transmitted infections (STIs)  Get screened for STIs, including gonorrhea and chlamydia, if: ? You are sexually active and are younger than 43 years of age. ? You are older than 43 years of age and your health care provider tells you that you are at  risk for this type of infection. ? Your sexual activity has changed since you were last screened, and you are at increased risk for chlamydia or gonorrhea. Ask your health care provider if you are at risk.  Ask your health care provider about whether you are at high risk for HIV. Your health care provider may recommend a prescription medicine to help prevent HIV infection. If you choose to take medicine to prevent HIV, you should first get tested for HIV. You should then be tested every 3 months for as long as you are taking the medicine. Pregnancy  If you are about to stop having your period (premenopausal) and you may become pregnant, seek counseling before you get pregnant.  Take 400 to 800 micrograms (mcg) of folic acid every day if you become pregnant.  Ask for birth control (contraception) if you want to prevent pregnancy. Osteoporosis and menopause Osteoporosis is a disease in which the bones lose minerals and strength with aging. This can result in bone fractures. If you are 4 years old or older, or if you are at risk for osteoporosis and fractures, ask your health care provider if you should:  Be screened for bone loss.  Take a calcium or vitamin D supplement to lower your risk of fractures.  Be given hormone replacement therapy (HRT) to treat symptoms of menopause. Follow these instructions at home: Lifestyle  Do not use any products that contain nicotine or tobacco, such as cigarettes, e-cigarettes, and chewing tobacco. If you need help quitting, ask your health care provider.  Do not use street drugs.  Do not share needles.  Ask your health care provider for help if you need support or information about quitting drugs. Alcohol use  Do not drink alcohol if: ? Your health care provider tells you not to drink. ? You are pregnant, may be pregnant, or are planning to become pregnant.  If you drink alcohol: ? Limit how much you use to 0-1 drink a day. ? Limit intake if you  are breastfeeding.  Be aware of how much alcohol is in your drink. In the [place] one drink equals one 12 oz bottle of beer (355 mL), one 5 oz glass of wine (148 mL), or one 1 oz glass of hard liquor (44 mL). General instructions  Schedule regular health, dental, and eye exams.  Stay current with your vaccines.  Tell your health care provider if: ? You often feel depressed. ? You have ever been abused or do not feel safe at home. Summary  Adopting a healthy lifestyle and getting preventive care are important in promoting health and wellness.  Follow your health care provider's instructions about healthy diet, exercising, and getting tested or screened for diseases.  Follow your health care provider's instructions on monitoring your cholesterol and blood pressure. This information is not intended to replace advice given to you by your health care provider. Make sure you discuss any questions you have with  your health care provider. Document Released: 04/28/2011 Document Revised: 10/06/2018 Document Reviewed: 10/06/2018 Elsevier Patient Education  [employer].    Bradycardia, Adult Bradycardia is a slower-than-normal heartbeat. A normal resting heart rate for an adult ranges from 60 to 100 beats per minute. With bradycardia, the resting heart rate is less than 60 beats per minute. Bradycardia can prevent enough oxygen from reaching certain areas of your body when you are active. It can be serious if it keeps enough oxygen from reaching your brain and other parts of your body. Bradycardia is not a problem for everyone. For some healthy adults, a slow resting heart rate is normal. What are the causes? This condition may be caused by:  A problem with the heart, including: ? A problem with the heart's electrical system, such as a heart block. With a heart block, electrical signals between the chambers of the heart are partially or completely blocked, so they are not able to work as  they should. ? A problem with the heart's natural pacemaker (sinus node). ? Heart disease. ? A heart attack. ? Heart damage. ? Lyme disease. ? A heart infection. ? A heart condition that is present at birth (congenital heart defect).  Certain medicines that treat heart conditions.  Certain conditions, such as hypothyroidism and obstructive sleep apnea.  Problems with the balance of chemicals and other substances, like potassium, in the blood.  Trauma.  Radiation therapy. What increases the risk? You are more likely to develop this condition if you:  Are age 70 or older.  Have high blood pressure (hypertension), high cholesterol (hyperlipidemia), or diabetes.  Drink heavily, use tobacco or nicotine products, or use drugs. What are the signs or symptoms? Symptoms of this condition include:  Light-headedness.  Feeling faint or fainting.  Fatigue and weakness.  Trouble with activity or exercise.  Shortness of breath.  Chest pain (angina).  Drowsiness.  Confusion.  Dizziness. How is this diagnosed? This condition may be diagnosed based on:  Your symptoms.  Your medical history.  A physical exam. During the exam, your health care provider will listen to your heartbeat and check your pulse. To confirm the diagnosis, your health care provider may order tests, such as:  Blood tests.  An electrocardiogram (ECG). This test records the heart's electrical activity. The test can show how fast your heart is beating and whether the heartbeat is steady.  A test in which you wear a portable device (event recorder or Holter monitor) to record your heart's electrical activity while you go about your day.  Anexercise test. How is this treated? Treatment for this condition depends on the cause of the condition and how severe your symptoms are. Treatment may involve:  Treatment of the underlying condition.  Changing your medicines or how much medicine you take.  Having  a small, battery-operated device called a pacemaker implanted under the skin. When bradycardia occurs, this device can be used to increase your heart rate and help your heart beat in a regular rhythm. Follow these instructions at home: Lifestyle   Manage any health conditions that contribute to bradycardia as told by your health care provider.  Follow a heart-healthy diet. A nutrition specialist (dietitian) can help educate you about healthy food options and changes.  Follow an exercise program that is approved by your health care provider.  Maintain a healthy weight.  Try to reduce or manage your stress, such as with yoga or meditation. If you need help reducing stress, ask your health  care provider.  Do not use any products that contain nicotine or tobacco, such as cigarettes, e-cigarettes, and chewing tobacco. If you need help quitting, ask your health care provider.  Do not use illegal drugs.  Limit alcohol intake to no more than 1 drink a day for nonpregnant women and 2 drinks a day for men. Be aware of how much alcohol is in your drink. In the [place] one drink equals one 12 oz bottle of beer (355 mL), one 5 oz glass of wine (148 mL), or one 1 oz glass of hard liquor (44 mL). General instructions  Take over-the-counter and prescription medicines only as told by your health care provider.  Keep all follow-up visits as told by your health care provider. This is important. How is this prevented? In some cases, bradycardia may be prevented by:  Treating underlying medical problems.  Stopping behaviors or medicines that can trigger the condition. Contact a health care provider if you:  Feel light-headed or dizzy.  Almost faint.  Feel weak or are easily fatigued during physical activity.  Experience confusion or have memory problems. Get help right away if:  You faint.  You have: ? An irregular heartbeat (palpitations). ? Chest pain. ? Trouble breathing. Summary   Bradycardia is a slower-than-normal heartbeat. With bradycardia, the resting heart rate is less than 60 beats per minute.  Treatment for this condition depends on the cause.  Manage any health conditions that contribute to bradycardia as told by your health care provider.  Do not use any products that contain nicotine or tobacco, such as cigarettes, e-cigarettes, and chewing tobacco, and limit alcohol intake.  Keep all follow-up visits as told by your health care provider. This is important. This information is not intended to replace advice given to you by your health care provider. Make sure you discuss any questions you have with your health care provider. Document Released: 07/05/2002 Document Revised: 04/26/2018 Document Reviewed: 03/24/2018 Elsevier Patient Education  2020 [employer].

## 2019-05-06 NOTE — Telephone Encounter (Signed)
Call pt Heard back from Dr Fletcher Anon who felt ekg looked good and that likely low HR related to being in such good shape  Please make a 6 week f/u for her

## 2019-05-06 NOTE — Assessment & Plan Note (Signed)
Fluctuating from 50 up to 58 during office visit today.  Largely asymptomatic.  Does have shortness of breath in 2 weeks prior to her menses however this is not persistent.  She is an avid runner, [employer] denies any shortness of breath during these activities.    no acute changes on EKG seen when compared to prior 2017.  Lead inversions were noted.  Advised patient that I will consult with Dr. Fletcher Anon  I do suspect a degree of anxiety is contributory.

## 2019-05-06 NOTE — Progress Notes (Signed)
Subjective:    Patient ID: Heather Fisher, female    DOB: 06/04/1976, 43 y.o.   MRN: [id number]  CC: Heather Fisher is a 43 y.o. female who presents today for physical exam.    HPI: Complains of increased anxiety of late. She is Pharmacist, hospital and worried about the Fall.  Had been on cymbalta in the past for PMD and thinks she may need to restart medication.   Continues to have menses. Notices that feel sob when going up the stairs 2 weeks prior to menses.  However then notes could go running and doesn't feel SOB. No SOB aside from prior menses.  No wheezing, coughing, fever.  Has been runner since 2011.  Running 2x per week;  bootcamp 2x per week as well without CP, SOB.   Wandering if anxiety / irritability is contributory to SOB  Former smoker. H/o asthma as child.   Fit bit HR 58 , lowest 53.   Denies exertional chest pain or pressure, numbness or tingling radiating to left arm or jaw, palpitations, dizziness, frequent headaches, changes in vision.    echo 2016 normal. Dr Fletcher Anon   Colorectal Cancer Screening: No family h/o colon cancer.  Breast Cancer Screening: Mammogram due. Cervical Cancer Screening: Due No  Longer following with GYN Bone Health screening/DEXA for 65+: No increased fracture risk. Defer screening at this time. Lung Cancer Screening: Doesn't have 30 year pack year history and age > 40 years.       Tetanus - UTD         Labs: Screening labs today. Exercise: Gets regular exercise.  Alcohol use: rarely Smoking/tobacco use: former smoker.  Regular dental exams: UTD Wears seat belt: Yes. Skin: No skin changes.   HISTORY:  Past Medical History:  Diagnosis Date  . Asthma   . Chicken pox   . Clotting disorder ([place])    Factor 5 [place]iden  . Hay fever   . Heart murmur    functional  . Scoliosis     Past Surgical History:  Procedure Laterality Date  . CESAREAN SECTION     2004. 2008  . tonsil    . WISDOM TOOTH EXTRACTION Bilateral    Family History   Problem Relation Age of Onset  . Arthritis Mother   . Skin cancer Mother   . Arthritis Maternal Grandmother   . Heart disease Maternal Grandmother   . Hypertension Maternal Grandmother   . Cancer Maternal Grandmother 27       breast  . Breast cancer Maternal Grandmother 28  . Heart disease Paternal Grandmother   . Diabetes Paternal Grandmother   . Heart murmur Father   . Colon cancer Neg Hx       ALLERGIES: Penicillins and Sulfonamide derivatives  Current Outpatient Medications on File Prior to Visit  Medication Sig Dispense Refill  . cetirizine (ZYRTEC) 10 MG tablet Take 10 mg by mouth daily.    . Cholecalciferol (VITAMIN D3) 10 MCG (400 UNIT) tablet Take 400 Units by mouth daily.    Marland Kitchen Specialty Vitamins Products (MAGNESIUM, AMINO ACID CHELATE,) 133 MG tablet Take 1 tablet by mouth 2 (two) times daily.     No current facility-administered medications on file prior to visit.     Social History   Tobacco Use  . Smoking status: Former Smoker    Packs/day: 0.25    Types: Cigarettes    Quit date: 04/20/2016    Years since quitting: 3.0  . Smokeless tobacco: Never Used  .  Tobacco comment: Smokes a couple per day.  Substance Use Topics  . Alcohol use: No    Alcohol/week: 0.0 standard drinks    Comment: Rarely   . Drug use: No    Review of Systems  Constitutional: Negative for chills, fever and unexpected weight change.  HENT: Negative for congestion.   Respiratory: Positive for shortness of breath. Negative for cough, chest tightness and wheezing.   Cardiovascular: Negative for chest pain, palpitations and leg swelling.  Gastrointestinal: Negative for nausea and vomiting.  Musculoskeletal: Negative for arthralgias and myalgias.  Skin: Negative for rash.  Neurological: Negative for headaches.  Hematological: Negative for adenopathy.  Psychiatric/Behavioral: Negative for confusion and suicidal ideas. The patient is nervous/anxious.       Objective:    BP (!) 88/52    Pulse (!) 58   Temp 98.1 F (36.7 C)   Ht 5' 4.5" (1.638 m)   Wt 164 lb 3.2 oz (74.5 kg)   SpO2 98%   BMI 27.75 kg/m   BP Readings from Last 3 Encounters:  05/06/19 (!) 88/52  04/21/18 118/80  04/16/17 100/60   Wt Readings from Last 3 Encounters:  05/06/19 164 lb 3.2 oz (74.5 kg)  04/21/18 161 lb (73 kg)  05/15/17 152 lb 12.8 oz (69.3 kg)    Physical Exam Vitals signs reviewed.  Constitutional:      Appearance: She is well-developed.  Eyes:     Conjunctiva/sclera: Conjunctivae normal.  Neck:     Thyroid: No thyroid mass or thyromegaly.  Cardiovascular:     Rate and Rhythm: Regular rhythm. Bradycardia present.     Pulses: Normal pulses.     Heart sounds: Normal heart sounds.  Pulmonary:     Effort: Pulmonary effort is normal.     Breath sounds: Normal breath sounds. No wheezing, rhonchi or rales.  Chest:     Breasts: Breasts are symmetrical.        Right: No inverted nipple, mass, nipple discharge, skin change or tenderness.        Left: No inverted nipple, mass, nipple discharge, skin change or tenderness.  Genitourinary:    Cervix: No cervical motion tenderness, discharge or friability.     Uterus: Not enlarged, not fixed and not tender.      Adnexa:        Right: No mass, tenderness or fullness.         Left: No mass, tenderness or fullness.       Comments: Pap performed. No CMT. Unable to appreciated ovaries. Lymphadenopathy:     Head:     Right side of head: No submental, submandibular, tonsillar, preauricular, posterior auricular or occipital adenopathy.     Left side of head: No submental, submandibular, tonsillar, preauricular, posterior auricular or occipital adenopathy.     Cervical:     Right cervical: No superficial, deep or posterior cervical adenopathy.    Left cervical: No superficial, deep or posterior cervical adenopathy.     Upper Body:     Right upper body: No pectoral adenopathy.     Left upper body: No pectoral adenopathy.  Skin:     General: Skin is warm and dry.  Neurological:     Mental Status: She is alert.  Psychiatric:        Speech: Speech normal.        Behavior: Behavior normal.        Thought Content: Thought content normal.        Assessment & Plan:  Problem List Items Addressed This Visit      Other   Routine physical examination - Primary    CBE and pap performed today.  Ordered mammogram and patient will schedule.      Relevant Orders   MM 3D SCREEN BREAST BILATERAL   TSH   CBC with Differential/Platelet   Comprehensive metabolic panel   Hemoglobin A1c   Lipid panel   VITAMIN D 25 Hydroxy (Vit-D Deficiency, Fractures)   Cytology - PAP   Ambulatory referral to Dermatology   Bradycardia    Fluctuating from 50 up to 58 during office visit today.  Largely asymptomatic.  Does have shortness of breath in 2 weeks prior to her menses however this is not persistent.  She is an avid runner, [employer] denies any shortness of breath during these activities.    no acute changes on EKG seen when compared to prior 2017.  Lead inversions were noted.  Advised patient that I will consult with Dr. Fletcher Anon  I do suspect a degree of anxiety is contributory.      Relevant Orders   DG Chest 2 View   EKG 12-Lead (Completed)   Depression, recurrent (HCC)    Trial of Zoloft.  Close follow-up      Relevant Medications   sertraline (ZOLOFT) 50 MG tablet       I am having Cidney E. Parenteau start on sertraline. I am also having her maintain her magnesium (amino acid chelate), cetirizine, and Vitamin D3.   Meds ordered this encounter  Medications  . sertraline (ZOLOFT) 50 MG tablet    Sig: Take 1 tablet (50 mg total) by mouth at bedtime.    Dispense:  90 tablet    Refill:  3    Order Specific Question:   Supervising Provider    Answer:   Crecencio Mc [[id number]]    Return precautions given.   Risks, benefits, and alternatives of the medications and treatment plan prescribed today were discussed, and  patient expressed understanding.   Education regarding symptom management and diagnosis given to patient on AVS.   Continue to follow with Burnard Hawthorne, FNP for routine health maintenance.   Gueydan E Vitug and I agreed with plan.   Mable Paris, FNP

## 2019-05-06 NOTE — Telephone Encounter (Signed)
-----   Message from Wellington Hampshire, MD sent at 05/06/2019  1:20 PM EDT ----- Heather Fisher, The EKG looks fine to me.  Inverted T waves in V1 and V2 can be a normal variant especially in  relatively young people.  She is likely bradycardic because she is in good physical shape. ----- Message ----- From: Burnard Hawthorne, FNP Sent: 05/06/2019   1:05 PM EDT To: Wellington Hampshire, MD  Dr Fletcher Anon,   [facility] you are well.  You saw this patient in 2017 for bradycardia.  She is in the office today for physical, she remains incredibly athletic running twice a week, doing CrossFit.I performed EKG today primarily because she complained of periodic shortness of breath.  It was a bit odd as SOB only when she was approaching her menses, and she was not sure if it was more of an anxiety component.  She starting a trial of Zoloft. Ordered CXR.  I repeated her EKG today, noted a couple of lead changes when I compared it to your EKG in 2017.I just wanted to consult with you to ensure you didn't feel she needed to see you.   She was happy to and I will place that referral if you feel like it necessary.  Thanks as always.  Catalina Antigua, NP [phone] cell

## 2019-05-09 ENCOUNTER — Encounter: Payer: Self-pay | Admitting: Family

## 2019-05-11 LAB — CYTOLOGY - PAP
Diagnosis: UNDETERMINED — AB
HPV: NOT DETECTED

## 2019-05-13 ENCOUNTER — Encounter: Payer: Self-pay | Admitting: Family

## 2019-06-20 ENCOUNTER — Ambulatory Visit (INDEPENDENT_AMBULATORY_CARE_PROVIDER_SITE_OTHER): Payer: BC Managed Care – PPO | Admitting: Family

## 2019-06-20 ENCOUNTER — Encounter: Payer: Self-pay | Admitting: Family

## 2019-06-20 DIAGNOSIS — R001 Bradycardia, unspecified: Secondary | ICD-10-CM | POA: Diagnosis not present

## 2019-06-20 DIAGNOSIS — F32A Depression, unspecified: Secondary | ICD-10-CM

## 2019-06-20 DIAGNOSIS — F329 Major depressive disorder, single episode, unspecified: Secondary | ICD-10-CM

## 2019-06-20 DIAGNOSIS — F419 Anxiety disorder, unspecified: Secondary | ICD-10-CM | POA: Diagnosis not present

## 2019-06-20 NOTE — Assessment & Plan Note (Addendum)
Overall feels better. Will continue zoloft.  Declines medication changes at this time.   Declines consult with pulmonlogy or seeing Dr Fletcher Anon again as she feels SOB had been related to stress. Advised again to stay vigilant and to consider pulmonology /cardiac consult to rule out other etiologies. She will let me know

## 2019-06-20 NOTE — Patient Instructions (Signed)
Continue zoloft as we discussed for now.   Please ensure you to call.

## 2019-06-20 NOTE — Progress Notes (Signed)
This visit type was conducted due to national recommendations for restrictions regarding the COVID-19 pandemic (e.g. social distancing).  This format is felt to be most appropriate for this patient at this time.  All issues noted in this document were discussed and addressed.  No physical exam was performed (except for noted visual exam findings with Video Visits). Virtual Visit via Video Note  I connected with@  on 06/23/19 at  4:00 PM EDT by a video enabled telemedicine application and verified that I am speaking with the correct person using two identifiers.  Location patient: home Location provider:work  Persons participating in the virtual visit: patient, provider  I discussed the limitations of evaluation and management by telemedicine and the availability of in person appointments. The patient expressed understanding and agreed to proceed.   HPI:  GAD and depression- feels like edge has been taken off with zoloft. Friends have noticed 'not as edgey'. Handling 'stressors' better.   States SOB 'has not been consistent' . It will go away. Feels like may be related to when more stress, such as when school started last week she noticed recurred.  Otherwise symptom will resolve and she denies shortness of breath  No CP, dizziness, palpitations. H/o bradycardia.  Continues to feel while doing Cross fit without symptoms of SOB.     ROS: See pertinent positives and negatives per HPI.  Past Medical History:  Diagnosis Date  . Asthma   . Chicken pox   . Clotting disorder ([place])    Factor 5 Liden  . Hay fever   . Heart murmur    functional  . Scoliosis     Past Surgical History:  Procedure Laterality Date  . CESAREAN SECTION     2004. 2008  . tonsil    . WISDOM TOOTH EXTRACTION Bilateral     Family History  Problem Relation Age of Onset  . Arthritis Mother   . Skin cancer Mother   . Arthritis Maternal Grandmother   . Heart disease Maternal Grandmother   . Hypertension  Maternal Grandmother   . Cancer Maternal Grandmother 48       breast  . Breast cancer Maternal Grandmother 54  . Heart disease Paternal Grandmother   . Diabetes Paternal Grandmother   . Heart murmur Father   . Colon cancer Neg Hx     SOCIAL HX: former smoker   Current Outpatient Medications:  .  cetirizine (ZYRTEC) 10 MG tablet, Take 10 mg by mouth daily., Disp: , Rfl:  .  Cholecalciferol (VITAMIN D3) 10 MCG (400 UNIT) tablet, Take 400 Units by mouth daily., Disp: , Rfl:  .  sertraline (ZOLOFT) 50 MG tablet, Take 1 tablet (50 mg total) by mouth at bedtime., Disp: 90 tablet, Rfl: 3 .  Specialty Vitamins Products (MAGNESIUM, AMINO ACID CHELATE,) 133 MG tablet, Take 1 tablet by mouth 2 (two) times daily., Disp: , Rfl:   EXAM:  VITALS per patient if applicable:  GENERAL: alert, oriented, appears well and in no acute distress  HEENT: atraumatic, conjunttiva clear, no obvious abnormalities on inspection of external nose and ears  NECK: normal movements of the head and neck  LUNGS: on inspection no signs of respiratory distress, breathing rate appears normal, no obvious gross SOB, gasping or wheezing  CV: no obvious cyanosis  MS: moves all visible extremities without noticeable abnormality  PSYCH/NEURO: pleasant and cooperative, no obvious depression or anxiety, speech and thought processing grossly intact  ASSESSMENT AND PLAN:  Discussed the following assessment and plan:  Problem List Items Addressed This Visit      Other   Bradycardia    Unchanged. Not consistent nor bothersome. Discussed again Dr Tyrell Antonio advice that likely related to her good level of fitness.  No dizziness.  Declines further testing - cardiology, pulmonology consult- at this time. She will continue to monitor symptom      Anxiety and depression    Overall feels better. Will continue zoloft.  Declines medication changes at this time.   Declines consult with pulmonlogy or seeing Dr Fletcher Anon again as she  feels SOB had been related to stress. Advised again to stay vigilant and to consider pulmonology /cardiac consult to rule out other etiologies. She will let me know            I discussed the assessment and treatment plan with the patient. The patient was provided an opportunity to ask questions and all were answered. The patient agreed with the plan and demonstrated an understanding of the instructions.   The patient was advised to call back or seek an in-person evaluation if the symptoms worsen or if the condition fails to improve as anticipated.   Mable Paris, FNP

## 2019-06-20 NOTE — Assessment & Plan Note (Addendum)
Unchanged. Not consistent nor bothersome. Discussed again Dr Tyrell Antonio advice that likely related to her good level of fitness.  No dizziness.  Declines further testing - cardiology, pulmonology consult- at this time. She will continue to monitor symptom

## 2019-06-23 ENCOUNTER — Encounter: Payer: Self-pay | Admitting: Family

## 2019-06-23 ENCOUNTER — Ambulatory Visit
Admission: RE | Admit: 2019-06-23 | Discharge: 2019-06-23 | Disposition: A | Discharge status: Home / Self Care | Payer: BC Managed Care – PPO | Source: Ambulatory Visit | Attending: Family | Admitting: Family

## 2019-06-23 DIAGNOSIS — Z1231 Encounter for screening mammogram for malignant neoplasm of breast: Secondary | ICD-10-CM | POA: Insufficient documentation

## 2019-06-23 DIAGNOSIS — Z0000 Encounter for general adult medical examination without abnormal findings: Secondary | ICD-10-CM | POA: Diagnosis not present

## 2019-06-24 ENCOUNTER — Other Ambulatory Visit: Payer: Self-pay

## 2019-06-24 ENCOUNTER — Ambulatory Visit (INDEPENDENT_AMBULATORY_CARE_PROVIDER_SITE_OTHER): Payer: BC Managed Care – PPO

## 2019-06-24 DIAGNOSIS — Z23 Encounter for immunization: Secondary | ICD-10-CM

## 2019-06-28 DIAGNOSIS — B078 Other viral warts: Secondary | ICD-10-CM | POA: Diagnosis not present

## 2019-06-28 DIAGNOSIS — L300 Nummular dermatitis: Secondary | ICD-10-CM | POA: Diagnosis not present

## 2019-06-28 DIAGNOSIS — Z1283 Encounter for screening for malignant neoplasm of skin: Secondary | ICD-10-CM | POA: Diagnosis not present

## 2019-06-28 DIAGNOSIS — D2272 Melanocytic nevi of left lower limb, including hip: Secondary | ICD-10-CM | POA: Diagnosis not present

## 2019-06-28 DIAGNOSIS — D2271 Melanocytic nevi of right lower limb, including hip: Secondary | ICD-10-CM | POA: Diagnosis not present

## 2019-12-19 ENCOUNTER — Ambulatory Visit: Payer: BC Managed Care – PPO | Attending: Internal Medicine

## 2019-12-19 DIAGNOSIS — Z23 Encounter for immunization: Secondary | ICD-10-CM | POA: Insufficient documentation

## 2019-12-19 NOTE — Progress Notes (Signed)
   Covid-19 Vaccination Clinic  Name:  Heather Fisher    MRN: [id number] DOB: 12/28/1975  12/19/2019  Heather Fisher was observed post Covid-19 immunization for 15 minutes without incidence. She was provided with Vaccine Information Sheet and instruction to access the V-Safe system.   Heather Fisher was instructed to call 911 with any severe reactions post vaccine: Marland Kitchen Difficulty breathing  . Swelling of your face and throat  . A fast heartbeat  . A bad rash all over your body  . Dizziness and weakness    Immunizations Administered    Name Date Dose VIS Date Route   Moderna COVID-19 Vaccine 12/19/2019 11:15 AM 0.5 mL 09/27/2019 Intramuscular   Manufacturer: Moderna   Lot: [id number]   [place]:[id number]

## 2020-01-17 ENCOUNTER — Ambulatory Visit: Payer: Self-pay | Attending: Internal Medicine

## 2020-01-17 DIAGNOSIS — Z23 Encounter for immunization: Secondary | ICD-10-CM

## 2020-01-17 NOTE — Progress Notes (Signed)
   Covid-19 Vaccination Clinic  Name:  Heather Fisher    MRN: [id number] DOB: [birth year]  01/17/2020  Heather Fisher was observed post Covid-19 immunization for 15 minutes without incident. She was provided with Vaccine Information Sheet and instruction to access the V-Safe system.   Heather Fisher was instructed to call 911 with any severe reactions post vaccine: Marland Kitchen Difficulty breathing  . Swelling of face and throat  . A fast heartbeat  . A bad rash all over body  . Dizziness and weakness   Immunizations Administered    Name Date Dose VIS Date Route   Moderna COVID-19 Vaccine 01/17/2020  1:30 PM 0.5 mL 09/27/2019 Intramuscular   Manufacturer: Levan Hurst   LotUT:[id number]   [place]:[id number]

## 2020-01-19 ENCOUNTER — Encounter: Payer: Self-pay | Admitting: Family

## 2020-04-02 ENCOUNTER — Ambulatory Visit (INDEPENDENT_AMBULATORY_CARE_PROVIDER_SITE_OTHER): Payer: BC Managed Care – PPO | Admitting: Family Medicine

## 2020-04-02 ENCOUNTER — Encounter: Payer: Self-pay | Admitting: Family Medicine

## 2020-04-02 ENCOUNTER — Other Ambulatory Visit: Payer: Self-pay

## 2020-04-02 VITALS — BP 90/56 | HR 54 | Temp 98.1°F | Ht 64.5 in | Wt 169.8 lb

## 2020-04-02 DIAGNOSIS — M7581 Other shoulder lesions, right shoulder: Secondary | ICD-10-CM | POA: Diagnosis not present

## 2020-04-02 DIAGNOSIS — M7541 Impingement syndrome of right shoulder: Secondary | ICD-10-CM | POA: Diagnosis not present

## 2020-04-02 NOTE — Patient Instructions (Signed)
Alleve 2 tabs by mouth two times a day over the counter: Take at least for 2 - 3 weeks. This is equal to a prescripton strength dose (GENERIC CHEAPER EQUIVALENT IS NAPROXEN SODIUM)    Advice about weightlifting with SHOULDER PROBLEMS:  1. Avoid overhead presses, military presses, clean and jerk, snatch, heavy incline bench presses, heavy bench presses, any kind of chest fly. Heavy lateral raises will hurt.  ALL OF THESE MAKE THE ROTATOR CUFF IMPINGE ON THE BONY ANATOMY ABOVE THEM AND OFTEN CAUSE RECURRENT BURSITIS AND ROTATOR CUFF TENDINITIS.  2. If you have to do bench presses then I would do the decline press and not lock out at the end of the lift. DUMBBELLS WILL TAKE STRESS OFF THE SHOULDER, SO I RECOMMEND SWITCHING FROM BAR PRESSES TO DUMBBELLS.  Continue with rotator cuff strengthening and scapular stabilizaton. Recommended that you could do all aerobic actvity.  3. All pulls, lat pull downs, pull-ups, mid-back low back, arms should be no problem. 4. Core is fine

## 2020-04-02 NOTE — Progress Notes (Signed)
Jon Kasparek T. Jojuan Champney, MD, [place] at [facility] [place], [address]  Phone: [phone]   FAX: [phone]  Heather Fisher - 44 y.o. female   MRN [id number]   Date of Birth: 11-Jul-1976  Date: 04/02/2020   PCP: Burnard Hawthorne, FNP   Referral: Burnard Hawthorne, FNP  Chief Complaint  Patient presents with   Shoulder Pain    Right    This visit occurred during the SARS-CoV-2 public health emergency.  Safety protocols were in place, including screening questions prior to the visit, additional usage of staff PPE, and extensive cleaning of exam room while observing appropriate contact time as indicated for disinfecting solutions.   Subjective:   Heather Fisher is a 44 y.o. very pleasant female patient with Body mass index is 28.69 kg/m. who presents with the following:  Is a pleasant young lady, and she works out with Sandie Ano.  She is here to evaluate her right-sided shoulder pain.  Only new exercises that she can recall are her beginning tennis in May.  She does usually work out routinely.  She has had some difficulty doing overhead presses and bench presses particularly.  Now she has pain with reaching across her body as well as abduction and some internal and external rotation.  She has some pain in a T-shirt distribution.  She is not having any numbness or tingling.  She does have some pain on the right aspect of the neck in the posterior.  Abd and EROM and IROM urt.    Review of Systems is noted in the HPI, as appropriate   Objective:   BP (!) 90/56    Pulse (!) 54    Temp 98.1 F (36.7 C) (Temporal)    Ht 5' 4.5" (1.638 m)    Wt 169 lb 12 oz (77 kg)    LMP 03/29/2020    SpO2 100%    BMI 28.69 kg/m    GEN: No acute distress; alert,appropriate. PULM: Breathing comfortably in no respiratory distress PSYCH: Normally interactive.   Shoulder: Right Inspection: No  muscle wasting or winging Ecchymosis/edema: neg  AC joint, scapula, clavicle: NT Cervical spine: NT, full ROM Spurling's: neg Abduction: full, 5/5 Flexion: full, 5/5 IR, full, lift-off: 5/5 ER at neutral: full, 5/5 AC crossover: neg Neer: pos Hawkins: pos Drop Test: neg Empty Can: pos Supraspinatus insertion: mild-mod T Bicipital groove: NT Speed's: neg Yergason's: neg Sulcus sign: neg Scapular dyskinesis: none C5-T1 intact  Neuro: Sensation intact Grip 5/5   Radiology: No results found.  Assessment and Plan:     ICD-10-CM   1. Impingement syndrome of right shoulder  M75.41   2. Rotator cuff tendinitis, right  M75.81    Total encounter time: 30 minutes. On the day of the patient encounter, this can include review of prior records, labs, and imaging.  Additional time can include counselling, consultation with peer MD in person or by telephone.  This also includes independent review of Radiology.   Classic presentation.  Shoulder anatomy was reviewed with the patient using and anatomical model.   Rotator cuff strengthening and rehabilitation was reviewed, emphasizing scapular stabilization and the Secondary Powerhouse.  [facility] program given to the patient. Ideally, rehab done at least 5-6 days a week.  The patient could benefit from formal PT to assist with scapular stabilization and RTC strengthening.   Patient Instructions  Alleve 2 tabs by mouth  two times a day over the counter: Take at least for 2 - 3 weeks. This is equal to a prescripton strength dose (GENERIC CHEAPER EQUIVALENT IS NAPROXEN SODIUM)    Advice about weightlifting with SHOULDER PROBLEMS:  1. Avoid overhead presses, military presses, clean and jerk, snatch, heavy incline bench presses, heavy bench presses, any kind of chest fly. Heavy lateral raises will hurt.  ALL OF THESE MAKE THE ROTATOR CUFF IMPINGE ON THE BONY ANATOMY ABOVE THEM AND OFTEN CAUSE RECURRENT BURSITIS AND ROTATOR CUFF  TENDINITIS.  2. If you have to do bench presses then I would do the decline press and not lock out at the end of the lift. DUMBBELLS WILL TAKE STRESS OFF THE SHOULDER, SO I RECOMMEND SWITCHING FROM BAR PRESSES TO DUMBBELLS.  Continue with rotator cuff strengthening and scapular stabilizaton. Recommended that you could do all aerobic actvity.  3. All pulls, lat pull downs, pull-ups, mid-back low back, arms should be no problem. 4. Core is fine     Follow-up: Return in about 6 weeks (around 05/14/2020).  No orders of the defined types were placed in this encounter.  There are no discontinued medications. No orders of the defined types were placed in this encounter.   Signed,  Maud Deed. Pennye Beeghly, MD   Outpatient Encounter Medications as of 04/02/2020  Medication Sig   cetirizine (ZYRTEC) 10 MG tablet Take 10 mg by mouth daily.   Cholecalciferol (VITAMIN D3) 10 MCG (400 UNIT) tablet Take 400 Units by mouth daily.   sertraline (ZOLOFT) 50 MG tablet Take 1 tablet (50 mg total) by mouth at bedtime.   Specialty Vitamins Products (MAGNESIUM, AMINO ACID CHELATE,) 133 MG tablet Take 1 tablet by mouth 2 (two) times daily.   No facility-administered encounter medications on file as of 04/02/2020.

## 2020-04-28 ENCOUNTER — Other Ambulatory Visit: Payer: Self-pay | Admitting: Family

## 2020-04-28 DIAGNOSIS — F339 Major depressive disorder, recurrent, unspecified: Secondary | ICD-10-CM

## 2020-05-09 ENCOUNTER — Other Ambulatory Visit: Payer: Self-pay | Admitting: Family

## 2020-05-14 ENCOUNTER — Encounter: Payer: BC Managed Care – PPO | Admitting: Family

## 2020-05-16 ENCOUNTER — Ambulatory Visit: Payer: BC Managed Care – PPO | Admitting: Family Medicine

## 2020-05-16 ENCOUNTER — Encounter: Payer: Self-pay | Admitting: Family Medicine

## 2020-05-16 ENCOUNTER — Other Ambulatory Visit: Payer: Self-pay

## 2020-05-16 VITALS — BP 110/72 | HR 57 | Temp 98.2°F | Ht 64.5 in | Wt 171.5 lb

## 2020-05-16 DIAGNOSIS — M7581 Other shoulder lesions, right shoulder: Secondary | ICD-10-CM | POA: Diagnosis not present

## 2020-05-16 DIAGNOSIS — M7711 Lateral epicondylitis, right elbow: Secondary | ICD-10-CM | POA: Diagnosis not present

## 2020-05-16 DIAGNOSIS — M7541 Impingement syndrome of right shoulder: Secondary | ICD-10-CM

## 2020-05-16 MED ORDER — PREDNISONE 20 MG PO TABS
ORAL_TABLET | ORAL | 0 refills | Status: DC
Start: 2020-05-16 — End: 2020-07-18

## 2020-05-16 NOTE — Progress Notes (Signed)
Heather Fisher T. [name], MD, [place] at [facility] [place], [address]  Phone: [phone]   FAX: [phone]  Heather Fisher - Fisher y.o. female   MRN [id number]   Date of Birth: 09/30/1976  Date: 05/16/2020   PCP: Burnard Hawthorne, FNP   Referral: Burnard Hawthorne, FNP  Chief Complaint  Patient presents with   Follow-up    Right Shoulder    This visit occurred during the SARS-CoV-2 public health emergency.  Safety protocols were in place, including screening questions prior to the visit, additional usage of staff PPE, and extensive cleaning of exam room while observing appropriate contact time as indicated for disinfecting solutions.   Subjective:   Heather Fisher is a 44 y.o. very pleasant female patient with Body mass index is 28.98 kg/m. who presents with the following:  F/u R shoulder impingement: Home exercises and altered activity.  Right shoulder is predominantly improved and better, but she still does have some pain with an arc of abduction.  She does have still a mild T-shirt distribution of pain.  Some modest pain with internal range of motion.  Primary concern at this point is right-sided lateral epicondylitis.    Length of symptoms: 1-2 mo Hand effected: R  Patient describes a dull ache on the lateral elbow. There is some translation in the proximal forearm and in the distal upper arm. It is painful to lift with the hand facing down and to lift with the thumb in an upright position. Supination is painful. Patient points to the lateral epicondyle as the point of maximal tenderness near ECRB.  No trauma.   No prior fractures or operative interventions in the effective hand. Prior PT or HEP: none  Denies numbness or tingling. No significant neck pain..   Review of Systems is noted in the HPI, as appropriate   Objective:   BP 110/72    Pulse (!) 57    Temp 98.2  F (36.8 C) (Temporal)    Ht 5' 4.5" (1.638 m)    Wt 171 lb 8 oz (77.8 kg)    LMP 04/21/2020    SpO2 98%    BMI 28.98 kg/m    GEN: No acute distress; alert,appropriate. PULM: Breathing comfortably in no respiratory distress PSYCH: Normally interactive.   Elbow: Right Ecchymosis or edema: neg ROM: full flexion, extension, pronation, supination Shoulder ROM: Full Flexion: 5/5 Extension: 5/5, PAINFUL Supination: 5/5, PAINFUL Pronation: 5/5 Wrist ext: 5/5 Wrist flexion: 5/5 No gross bony abnormality Varus and Valgus stress: stable ECRB tenderness: YES, TTP Medial epicondyle: NT Lateral epicondyle, resisted wrist extension from wrist full pronation and flexion: PAINFUL grip: 5/5  sensation intact Tinel's, Elbow: negative  Shoulder: Right Inspection: No muscle wasting or winging Ecchymosis/edema: neg  AC joint, scapula, clavicle: NT Cervical spine: NT, full ROM Spurling's: neg Abduction: full, 5/5 Flexion: full, 5/5 IR, full, lift-off: 5/5 ER at neutral: full, 5/5 AC crossover and compression: neg Neer: Minimally positive Hawkins: Modestly positive Drop Test: neg Empty Can: neg Supraspinatus insertion: NT Bicipital groove: NT Speed's: neg Yergason's: neg Sulcus sign: neg Scapular dyskinesis: none C5-T1 intact Sensation intact Grip 5/5     Radiology: No results found.  Assessment and Plan:     ICD-10-CM   1. Lateral epicondylitis of right elbow  M77.11   2. Impingement syndrome of right shoulder  M75.41   3. Rotator cuff tendinitis, right  M75.81  Chronic shoulder and elbow pain with deteriorated elbow pain.  Deteriorated elbow pain consistent with lateral epicondylitis flare as well as to a much lesser extent medial epicondylitis.  Elbow anatomy was reviewed, and tendinopathy was explained.  Pt. given a home rehab program. Start with isometrics and ROM, then a series of concentric and eccentric exercises should be done starting with no weight, work up  to 1 lb, hammer, etc.  Use counterforce strap if working or using hands.  Formal PT would be beneficial if symptoms persist Emphasized stretching an cross-friction massage Emphasized proper palms up lifting biomechanics to unload ECRB   I am going to give her some oral steroids to hopefully help lateral and medial epicondylitis as well as her right-sided rotator cuff impingement.  Follow-up: Return in about 6 weeks (around 06/27/2020).  Meds ordered this encounter  Medications   predniSONE (DELTASONE) 20 MG tablet    Sig: 2 tabs po daily for 5 days, then 1 tab po daily for 5 days    Dispense:  15 tablet    Refill:  0   There are no discontinued medications. No orders of the defined types were placed in this encounter.   Signed,  Maud Deed. Akylah Hascall, MD   Outpatient Encounter Medications as of 05/16/2020  Medication Sig   cetirizine (ZYRTEC) 10 MG tablet Take 10 mg by mouth daily.   Cholecalciferol (VITAMIN D3) 10 MCG (400 UNIT) tablet Take 400 Units by mouth daily.   Polypodium Leucotomos (HELIOCARE) 240 MG CAPS    sertraline (ZOLOFT) 50 MG tablet TAKE 1 TABLET BY MOUTH EVERYDAY AT BEDTIME   Specialty Vitamins Products (MAGNESIUM, AMINO ACID CHELATE,) 133 MG tablet Take 1 tablet by mouth 2 (two) times daily.   predniSONE (DELTASONE) 20 MG tablet 2 tabs po daily for 5 days, then 1 tab po daily for 5 days   No facility-administered encounter medications on file as of 05/16/2020.

## 2020-05-17 ENCOUNTER — Other Ambulatory Visit (HOSPITAL_COMMUNITY)
Admission: RE | Admit: 2020-05-17 | Discharge: 2020-05-17 | Disposition: A | Discharge status: Home / Self Care | Payer: BC Managed Care – PPO | Source: Ambulatory Visit | Attending: Family | Admitting: Family

## 2020-05-17 ENCOUNTER — Ambulatory Visit (INDEPENDENT_AMBULATORY_CARE_PROVIDER_SITE_OTHER): Payer: BC Managed Care – PPO | Admitting: Family

## 2020-05-17 ENCOUNTER — Encounter: Payer: Self-pay | Admitting: Family

## 2020-05-17 VITALS — BP 114/76 | HR 64 | Temp 97.8°F | Ht 64.49 in | Wt 171.0 lb

## 2020-05-17 DIAGNOSIS — Z0000 Encounter for general adult medical examination without abnormal findings: Secondary | ICD-10-CM

## 2020-05-17 LAB — HEMOGLOBIN A1C: Hgb A1c MFr Bld: 5.4 % (ref 4.6–6.5)

## 2020-05-17 LAB — CBC WITH DIFFERENTIAL/PLATELET
Basophils Absolute: 0 10*3/uL (ref 0.0–0.1)
Basophils Relative: 0.2 % (ref 0.0–3.0)
Eosinophils Absolute: 0 10*3/uL (ref 0.0–0.7)
Eosinophils Relative: 0 % (ref 0.0–5.0)
HCT: 38.5 % (ref 36.0–46.0)
Hemoglobin: 13.2 g/dL (ref 12.0–15.0)
Lymphocytes Relative: 6.2 % — ABNORMAL LOW (ref 12.0–46.0)
Lymphs Abs: 0.6 10*3/uL — ABNORMAL LOW (ref 0.7–4.0)
MCHC: 34.3 g/dL (ref 30.0–36.0)
MCV: 88.4 fl (ref 78.0–100.0)
Monocytes Absolute: 0.1 10*3/uL (ref 0.1–1.0)
Monocytes Relative: 0.7 % — ABNORMAL LOW (ref 3.0–12.0)
Neutro Abs: 8.3 10*3/uL — ABNORMAL HIGH (ref 1.4–7.7)
Neutrophils Relative %: 92.9 % — ABNORMAL HIGH (ref 43.0–77.0)
Platelets: 261 10*3/uL (ref 150.0–400.0)
RBC: 4.36 Mil/uL (ref 3.87–5.11)
RDW: 12.9 % (ref 11.5–15.5)
WBC: 9 10*3/uL (ref 4.0–10.5)

## 2020-05-17 LAB — COMPREHENSIVE METABOLIC PANEL
ALT: 12 U/L (ref 0–35)
AST: 17 U/L (ref 0–37)
Albumin: 4.7 g/dL (ref 3.5–5.2)
Alkaline Phosphatase: 46 U/L (ref 39–117)
BUN: 15 mg/dL (ref 6–23)
CO2: 27 mEq/L (ref 19–32)
Calcium: 10 mg/dL (ref 8.4–10.5)
Chloride: 101 mEq/L (ref 96–112)
Creatinine, Ser: 0.81 mg/dL (ref 0.40–1.20)
GFR: 76.62 mL/min (ref >60.00)
Glucose, Bld: 117 mg/dL — ABNORMAL HIGH (ref 70–99)
Potassium: 4.2 mEq/L (ref 3.5–5.1)
Sodium: 134 mEq/L — ABNORMAL LOW (ref 135–145)
Total Bilirubin: 0.6 mg/dL (ref 0.2–1.2)
Total Protein: 7.7 g/dL (ref 6.0–8.3)

## 2020-05-17 LAB — LIPID PANEL
Cholesterol: 221 mg/dL — ABNORMAL HIGH (ref 0–200)
HDL: 76.8 mg/dL (ref >39.00)
LDL Cholesterol: 132 mg/dL — ABNORMAL HIGH (ref 0–99)
NonHDL: 143.73
Total CHOL/HDL Ratio: 3
Triglycerides: 58 mg/dL (ref 0.0–149.0)
VLDL: 11.6 mg/dL (ref 0.0–40.0)

## 2020-05-17 LAB — VITAMIN D 25 HYDROXY (VIT D DEFICIENCY, FRACTURES): VITD: 36.31 ng/mL (ref 30.00–100.00)

## 2020-05-17 LAB — TSH: TSH: 1.05 u[IU]/mL (ref 0.35–4.50)

## 2020-05-17 NOTE — Progress Notes (Signed)
Subjective:    Patient ID: Heather Fisher, female    DOB: Mar 13, 1976, 44 y.o.   MRN: [id number]  CC: Heather Fisher is a 44 y.o. female who presents today for physical exam.    HPI: Feels well today No concerns  Anxiety and depression-doing well on zoloft.    H/o factor V Leiden  Colorectal Cancer Screening: No early family history Breast Cancer Screening: Mammogram due.  Cervical Cancer Screening: due; atypical cells seen  Last year Bone Health screening/DEXA for 65+: No increased fracture risk. Defer screening at this time. Lung Cancer Screening: Doesn't have 30 year pack year history and age > 21 years.       Tetanus - utd         Hepatitis C screening - Candidate for, consents  Labs: Screening labs today. Exercise: Gets regular exercise, gym 5 days per week.  Alcohol use: rarely Smoking/tobacco use: former smoker.  Follows with [facility]  HISTORY:  Past Medical History:  Diagnosis Date  . Asthma   . Chicken pox   . Clotting disorder ([place])    Factor 5 Liden  . Hay fever   . Heart murmur    functional  . Scoliosis     Past Surgical History:  Procedure Laterality Date  . CESAREAN SECTION     2004. 2008  . tonsil    . WISDOM TOOTH EXTRACTION Bilateral    Family History  Problem Relation Age of Onset  . Arthritis Mother   . Skin cancer Mother   . Arthritis Maternal Grandmother   . Heart disease Maternal Grandmother   . Hypertension Maternal Grandmother   . Cancer Maternal Grandmother 56       breast  . Breast cancer Maternal Grandmother 75  . Heart disease Paternal Grandmother   . Diabetes Paternal Grandmother   . Heart murmur Father   . Colon cancer Neg Hx       ALLERGIES: Penicillins and Sulfonamide derivatives  Current Outpatient Medications on File Prior to Visit  Medication Sig Dispense Refill  . cetirizine (ZYRTEC) 10 MG tablet Take 10 mg by mouth daily.    . Cholecalciferol (VITAMIN D3) 10 MCG (400 UNIT) tablet Take 400 Units by  mouth daily.    . Polypodium Leucotomos (HELIOCARE) 240 MG CAPS     . predniSONE (DELTASONE) 20 MG tablet 2 tabs po daily for 5 days, then 1 tab po daily for 5 days 15 tablet 0  . sertraline (ZOLOFT) 50 MG tablet TAKE 1 TABLET BY MOUTH EVERYDAY AT BEDTIME 90 tablet 3  . Specialty Vitamins Products (MAGNESIUM, AMINO ACID CHELATE,) 133 MG tablet Take 1 tablet by mouth 2 (two) times daily.     No current facility-administered medications on file prior to visit.    Social History   Tobacco Use  . Smoking status: Former Smoker    Packs/day: 0.25    Types: Cigarettes    Quit date: 04/20/2016    Years since quitting: 4.0  . Smokeless tobacco: Never Used  . Tobacco comment: Smokes a couple per day.  Substance Use Topics  . Alcohol use: No    Alcohol/week: 0.0 standard drinks    Comment: Rarely   . Drug use: No    Review of Systems  Constitutional: Negative for chills, fever and unexpected weight change.  HENT: Negative for congestion.   Respiratory: Negative for cough.   Cardiovascular: Negative for chest pain, palpitations and leg swelling.  Gastrointestinal: Negative for nausea and vomiting.  Musculoskeletal: Negative for arthralgias and myalgias.  Skin: Negative for rash.  Neurological: Negative for headaches.  Hematological: Negative for adenopathy.  Psychiatric/Behavioral: Negative for confusion.      Objective:    BP 114/76 (BP Location: Left Arm, Patient Position: Sitting)   Pulse 64   Temp 97.8 F (36.6 C)   Ht 5' 4.49" (1.638 m)   Wt 171 lb (77.6 kg)   LMP 04/21/2020   SpO2 99%   BMI 28.91 kg/m   BP Readings from Last 3 Encounters:  05/17/20 114/76  05/16/20 110/72  04/02/20 (!) 90/56   Wt Readings from Last 3 Encounters:  05/17/20 171 lb (77.6 kg)  05/16/20 171 lb 8 oz (77.8 kg)  04/02/20 169 lb 12 oz (77 kg)    Physical Exam Vitals reviewed.  Constitutional:      Appearance: She is well-developed.  Eyes:     Conjunctiva/sclera: Conjunctivae  normal.  Neck:     Thyroid: No thyroid mass or thyromegaly.  Cardiovascular:     Rate and Rhythm: Normal rate and regular rhythm.     Pulses: Normal pulses.     Heart sounds: Normal heart sounds.  Pulmonary:     Effort: Pulmonary effort is normal.     Breath sounds: Normal breath sounds. No wheezing, rhonchi or rales.  Chest:     Breasts: Breasts are symmetrical.        Right: No inverted nipple, mass, nipple discharge, skin change or tenderness.        Left: No inverted nipple, mass, nipple discharge, skin change or tenderness.  Genitourinary:    Cervix: No cervical motion tenderness, discharge or friability.     Uterus: Not enlarged, not fixed and not tender.      Adnexa:        Right: No mass, tenderness or fullness.         Left: No mass, tenderness or fullness.       Comments: Pap performed. No CMT. Unable to appreciated ovaries. Lymphadenopathy:     Head:     Right side of head: No submental, submandibular, tonsillar, preauricular, posterior auricular or occipital adenopathy.     Left side of head: No submental, submandibular, tonsillar, preauricular, posterior auricular or occipital adenopathy.     Cervical:     Right cervical: No superficial, deep or posterior cervical adenopathy.    Left cervical: No superficial, deep or posterior cervical adenopathy.     Upper Body:     Right upper body: No pectoral adenopathy.     Left upper body: No pectoral adenopathy.  Skin:    General: Skin is warm and dry.  Neurological:     Mental Status: She is alert.  Psychiatric:        Speech: Speech normal.        Behavior: Behavior normal.        Thought Content: Thought content normal.        Assessment & Plan:   Problem List Items Addressed This Visit      Other   Routine physical examination - Primary    Clinical breast exam and Pap smear performed today.  Ordered mammogram and patient understands to schedule      Relevant Orders   Cytology - PAP   TSH   CBC with  Differential/Platelet   Comprehensive metabolic panel   Hemoglobin A1c   Lipid panel   VITAMIN D 25 Hydroxy (Vit-D Deficiency, Fractures)   Hepatitis C antibody   MM 3D  SCREEN BREAST BILATERAL   ABO AND RH        I am having Heather Fisher maintain her magnesium (amino acid chelate), cetirizine, Vitamin D3, sertraline, Heliocare, and predniSONE.   No orders of the defined types were placed in this encounter.   Return precautions given.   Risks, benefits, and alternatives of the medications and treatment plan prescribed today were discussed, and patient expressed understanding.   Education regarding symptom management and diagnosis given to patient on AVS.   Continue to follow with Burnard Hawthorne, FNP for routine health maintenance.   Heather Fisher and I agreed with plan.   Mable Paris, FNP

## 2020-05-17 NOTE — Patient Instructions (Signed)
Nice to see you!  Please call  and schedule your 3D mammogram as discussed.   [facility]  [place], [place]    Health Maintenance, Female Adopting a healthy lifestyle and getting preventive care are important in promoting health and wellness. Ask your health care provider about:  The right schedule for you to have regular tests and exams.  Things you can do on your own to prevent diseases and keep yourself healthy. What should I know about diet, weight, and exercise? Eat a healthy diet   Eat a diet that includes plenty of vegetables, fruits, low-fat dairy products, and lean protein.  Do not eat a lot of foods that are high in solid fats, added sugars, or sodium. Maintain a healthy weight Body mass index (BMI) is used to identify weight problems. It estimates body fat based on height and weight. Your health care provider can help determine your BMI and help you achieve or maintain a healthy weight. Get regular exercise Get regular exercise. This is one of the most important things you can do for your health. Most adults should:  Exercise for at least 150 minutes each week. The exercise should increase your heart rate and make you sweat (moderate-intensity exercise).  Do strengthening exercises at least twice a week. This is in addition to the moderate-intensity exercise.  Spend less time sitting. Even light physical activity can be beneficial. Watch cholesterol and blood lipids Have your blood tested for lipids and cholesterol at 45 years of age, then have this test every 5 years. Have your cholesterol levels checked more often if:  Your lipid or cholesterol levels are high.  You are older than 44 years of age.  You are at high risk for heart disease. What should I know about cancer screening? Depending on your health history and family history, you may need to have cancer screening at various ages. This may include  screening for:  Breast cancer.  Cervical cancer.  Colorectal cancer.  Skin cancer.  Lung cancer. What should I know about heart disease, diabetes, and high blood pressure? Blood pressure and heart disease  High blood pressure causes heart disease and increases the risk of stroke. This is more likely to develop in people who have high blood pressure readings, are of African descent, or are overweight.  Have your blood pressure checked: ? Every 3-5 years if you are 70-9 years of age. ? Every year if you are 68 years old or older. Diabetes Have regular diabetes screenings. This checks your fasting blood sugar level. Have the screening done:  Once every three years after age 2 if you are at a normal weight and have a low risk for diabetes.  More often and at a younger age if you are overweight or have a high risk for diabetes. What should I know about preventing infection? Hepatitis B If you have a higher risk for hepatitis B, you should be screened for this virus. Talk with your health care provider to find out if you are at risk for hepatitis B infection. Hepatitis C Testing is recommended for:  Everyone born from [birth year] through 1965.  Anyone with known risk factors for hepatitis C. Sexually transmitted infections (STIs)  Get screened for STIs, including gonorrhea and chlamydia, if: ? You are sexually active and are younger than 44 years of age. ? You are older than 44 years of age and your health care provider tells you that you are  at risk for this type of infection. ? Your sexual activity has changed since you were last screened, and you are at increased risk for chlamydia or gonorrhea. Ask your health care provider if you are at risk.  Ask your health care provider about whether you are at high risk for HIV. Your health care provider may recommend a prescription medicine to help prevent HIV infection. If you choose to take medicine to prevent HIV, you should first get  tested for HIV. You should then be tested every 3 months for as long as you are taking the medicine. Pregnancy  If you are about to stop having your period (premenopausal) and you may become pregnant, seek counseling before you get pregnant.  Take 400 to 800 micrograms (mcg) of folic acid every day if you become pregnant.  Ask for birth control (contraception) if you want to prevent pregnancy. Osteoporosis and menopause Osteoporosis is a disease in which the bones lose minerals and strength with aging. This can result in bone fractures. If you are 61 years old or older, or if you are at risk for osteoporosis and fractures, ask your health care provider if you should:  Be screened for bone loss.  Take a calcium or vitamin D supplement to lower your risk of fractures.  Be given hormone replacement therapy (HRT) to treat symptoms of menopause. Follow these instructions at home: Lifestyle  Do not use any products that contain nicotine or tobacco, such as cigarettes, e-cigarettes, and chewing tobacco. If you need help quitting, ask your health care provider.  Do not use street drugs.  Do not share needles.  Ask your health care provider for help if you need support or information about quitting drugs. Alcohol use  Do not drink alcohol if: ? Your health care provider tells you not to drink. ? You are pregnant, may be pregnant, or are planning to become pregnant.  If you drink alcohol: ? Limit how much you use to 0-1 drink a day. ? Limit intake if you are breastfeeding.  Be aware of how much alcohol is in your drink. In the [place] one drink equals one 12 oz bottle of beer (355 mL), one 5 oz glass of wine (148 mL), or one 1 oz glass of hard liquor (44 mL). General instructions  Schedule regular health, dental, and eye exams.  Stay current with your vaccines.  Tell your health care provider if: ? You often feel depressed. ? You have ever been abused or do not feel safe at  home. Summary  Adopting a healthy lifestyle and getting preventive care are important in promoting health and wellness.  Follow your health care provider's instructions about healthy diet, exercising, and getting tested or screened for diseases.  Follow your health care provider's instructions on monitoring your cholesterol and blood pressure. This information is not intended to replace advice given to you by your health care provider. Make sure you discuss any questions you have with your health care provider. Document Revised: 10/06/2018 Document Reviewed: 10/06/2018 Elsevier Patient Education  2020 [employer].

## 2020-05-17 NOTE — Assessment & Plan Note (Signed)
Clinical breast exam and Pap smear performed today.  Ordered mammogram and patient understands to schedule

## 2020-05-18 LAB — HEPATITIS C ANTIBODY
Hepatitis C Ab: NONREACTIVE
SIGNAL TO CUT-OFF: 0.02 (ref <1.00)

## 2020-05-18 LAB — ABO AND RH

## 2020-05-21 LAB — CYTOLOGY - PAP
Comment: NEGATIVE
Diagnosis: NEGATIVE
High risk HPV: NEGATIVE

## 2020-05-22 ENCOUNTER — Other Ambulatory Visit: Payer: Self-pay | Admitting: Family

## 2020-05-22 DIAGNOSIS — R7989 Other specified abnormal findings of blood chemistry: Secondary | ICD-10-CM

## 2020-06-08 ENCOUNTER — Encounter: Payer: Self-pay | Admitting: Family

## 2020-06-27 ENCOUNTER — Other Ambulatory Visit: Payer: Self-pay

## 2020-06-27 ENCOUNTER — Ambulatory Visit: Payer: BC Managed Care – PPO | Admitting: Dermatology

## 2020-06-27 DIAGNOSIS — L814 Other melanin hyperpigmentation: Secondary | ICD-10-CM

## 2020-06-27 DIAGNOSIS — D2271 Melanocytic nevi of right lower limb, including hip: Secondary | ICD-10-CM | POA: Diagnosis not present

## 2020-06-27 DIAGNOSIS — Z1283 Encounter for screening for malignant neoplasm of skin: Secondary | ICD-10-CM | POA: Diagnosis not present

## 2020-06-27 DIAGNOSIS — D229 Melanocytic nevi, unspecified: Secondary | ICD-10-CM

## 2020-06-27 DIAGNOSIS — D1800 Hemangioma unspecified site: Secondary | ICD-10-CM

## 2020-06-27 DIAGNOSIS — L720 Epidermal cyst: Secondary | ICD-10-CM

## 2020-06-27 DIAGNOSIS — L821 Other seborrheic keratosis: Secondary | ICD-10-CM

## 2020-06-27 DIAGNOSIS — L578 Other skin changes due to chronic exposure to nonionizing radiation: Secondary | ICD-10-CM

## 2020-06-27 NOTE — Patient Instructions (Addendum)
Melanoma ABCDEs  Melanoma is the most dangerous type of skin cancer, and is the leading cause of death from skin disease.  You are more likely to develop melanoma if you:  Have light-colored skin, light-colored eyes, or red or blond hair  Spend a lot of time in the sun  Tan regularly, either outdoors or in a tanning bed  Have had blistering sunburns, especially during childhood  Have a close family member who has had a melanoma  Have atypical moles or large birthmarks  Early detection of melanoma is key since treatment is typically straightforward and cure rates are extremely high if we catch it early.   The first sign of melanoma is often a change in a mole or a new dark spot.  The ABCDE system is a way of remembering the signs of melanoma.  A for asymmetry:  The two halves do not match. B for border:  The edges of the growth are irregular. C for color:  A mixture of colors are present instead of an even brown color. D for diameter:  Melanomas are usually (but not always) greater than 6mm - the size of a pencil eraser. E for evolution:  The spot keeps changing in size, shape, and color.  Please check your skin once per month between visits. You can use a small mirror in front and a large mirror behind you to keep an eye on the back side or your body.   If you see any new or changing lesions before your next follow-up, please call to schedule a visit.  Please continue daily skin protection including broad spectrum sunscreen SPF 30+ to sun-exposed areas, reapplying every 2 hours as needed when you're outdoors.    Recommend taking Heliocare sun protection supplement daily in sunny weather for additional sun protection. For maximum protection on the sunniest days, you can take up to 2 capsules of regular Heliocare OR take 1 capsule of Heliocare Ultra. For prolonged exposure (such as a full day in the sun), you can repeat your dose of the supplement 4 hours after your first dose.  Heliocare can be purchased at [facility] or at [email or web address].   

## 2020-06-27 NOTE — Progress Notes (Signed)
   Follow-Up Visit   Subjective  Heather Fisher is a 44 y.o. female who presents for the following:  full body skin exam and skin cancer screening  Patient advises she had a cyst excised at left upper back and it may be recurrent. No history of skin cancer or abnormal findings.   The following portions of the chart were reviewed this encounter and updated as appropriate:  Tobacco  Allergies  Meds  Problems  Med Hx  Surg Hx  Fam Hx      Review of Systems:  No other skin or systemic complaints except as noted in HPI or Assessment and Plan.  Objective  Well appearing patient in no apparent distress; mood and affect are within normal limits.  A full examination was performed including scalp, head, eyes, ears, nose, lips, neck, chest, axillae, abdomen, back, buttocks, bilateral upper extremities, bilateral lower extremities, hands, feet, fingers, toes, fingernails, and toenails. All findings within normal limits unless otherwise noted below.  Objective  Right Medial Foot: 0.6cm brown macule  Objective  Left Upper Back: Subcutaneous nodule 1.0cm   Assessment & Plan  Nevus Right Medial Foot  Benign-appearing.  Observation.  Call clinic for new or changing moles.  Recommend daily use of broad spectrum spf 30+ sunscreen to sun-exposed areas.    Epidermal inclusion cyst Left Upper Back  Benign, observe.   Discussed excision, including resulting scar.      Lentigines - Scattered tan macules - Discussed due to sun exposure - Benign, observe - Call for any changes  Seborrheic Keratoses - Stuck-on, waxy, tan-brown papules and plaques  - Discussed benign etiology and prognosis. - Observe - Call for any changes  Melanocytic Nevi - Tan-brown and/or pink-flesh-colored symmetric macules and papules - Benign appearing on exam today - Observation - Call clinic for new or changing moles - Recommend daily use of broad spectrum spf 30+ sunscreen to sun-exposed areas.    Hemangiomas - Red papules - Discussed benign nature - Observe - Call for any changes  Actinic Damage - diffuse scaly erythematous macules with underlying dyspigmentation - Recommend daily broad spectrum sunscreen SPF 30+ to sun-exposed areas, reapply every 2 hours as needed.  - Call for new or changing lesions.  Skin cancer screening performed today.  Return in about 1 year (around 06/27/2021) for TBSE.  Graciella Belton, RMA, am acting as scribe for Forest Gleason, MD .  Documentation: I have reviewed the above documentation for accuracy and completeness, and I agree with the above.  Forest Gleason, MD

## 2020-07-03 ENCOUNTER — Other Ambulatory Visit: Payer: BC Managed Care – PPO

## 2020-07-04 ENCOUNTER — Ambulatory Visit: Payer: BC Managed Care – PPO | Admitting: Family Medicine

## 2020-07-10 ENCOUNTER — Encounter: Payer: Self-pay | Admitting: Dermatology

## 2020-07-13 ENCOUNTER — Encounter: Payer: Self-pay | Admitting: Family

## 2020-07-17 ENCOUNTER — Other Ambulatory Visit: Payer: Self-pay

## 2020-07-17 ENCOUNTER — Other Ambulatory Visit (INDEPENDENT_AMBULATORY_CARE_PROVIDER_SITE_OTHER): Payer: BC Managed Care – PPO

## 2020-07-17 DIAGNOSIS — R7989 Other specified abnormal findings of blood chemistry: Secondary | ICD-10-CM

## 2020-07-18 ENCOUNTER — Encounter: Payer: Self-pay | Admitting: Family Medicine

## 2020-07-18 ENCOUNTER — Ambulatory Visit: Payer: BC Managed Care – PPO | Admitting: Family Medicine

## 2020-07-18 VITALS — BP 100/60 | HR 49 | Temp 98.5°F | Ht 64.5 in | Wt 168.5 lb

## 2020-07-18 DIAGNOSIS — M7541 Impingement syndrome of right shoulder: Secondary | ICD-10-CM

## 2020-07-18 DIAGNOSIS — M7711 Lateral epicondylitis, right elbow: Secondary | ICD-10-CM | POA: Diagnosis not present

## 2020-07-18 LAB — CBC WITH DIFFERENTIAL/PLATELET
Basophils Absolute: 0.1 10*3/uL (ref 0.0–0.1)
Basophils Relative: 1.3 % (ref 0.0–3.0)
Eosinophils Absolute: 0.4 10*3/uL (ref 0.0–0.7)
Eosinophils Relative: 5.6 % — ABNORMAL HIGH (ref 0.0–5.0)
HCT: 37.7 % (ref 36.0–46.0)
Hemoglobin: 12.9 g/dL (ref 12.0–15.0)
Lymphocytes Relative: 30.5 % (ref 12.0–46.0)
Lymphs Abs: 2 10*3/uL (ref 0.7–4.0)
MCHC: 34.2 g/dL (ref 30.0–36.0)
MCV: 89.1 fl (ref 78.0–100.0)
Monocytes Absolute: 0.5 10*3/uL (ref 0.1–1.0)
Monocytes Relative: 7.2 % (ref 3.0–12.0)
Neutro Abs: 3.6 10*3/uL (ref 1.4–7.7)
Neutrophils Relative %: 55.4 % (ref 43.0–77.0)
Platelets: 275 10*3/uL (ref 150.0–400.0)
RBC: 4.23 Mil/uL (ref 3.87–5.11)
RDW: 13.1 % (ref 11.5–15.5)
WBC: 6.5 10*3/uL (ref 4.0–10.5)

## 2020-07-18 NOTE — Progress Notes (Signed)
Heather Fisher T. Erick Oxendine, MD, [place] at [facility] [name], [address]  Phone: [phone]   FAX: [phone]  [name] - 44 y.o. female   MRN [id number]   Date of Birth: 11/29/1975  Date: 07/18/2020   PCP: Burnard Hawthorne, FNP   Referral: Burnard Hawthorne, FNP  Chief Complaint  Patient presents with   Follow-up    Right Shoulder/Elbow    This visit occurred during the SARS-CoV-2 public health emergency.  Safety protocols were in place, including screening questions prior to the visit, additional usage of staff PPE, and extensive cleaning of exam room while observing appropriate contact time as indicated for disinfecting solutions.   Subjective:   Heather Fisher is a 44 y.o. very pleasant female patient with Body mass index is 28.48 kg/m. who presents with the following:  She is here primarily to follow-up on her R LE. Last office appointment, I gave her some home rehab and oral steroids.   Elbow is not any better. Has not been exercising.  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  She has not been able to do much in terms of her home rehab program.  Her pain in her right shoulder has flared up again.  05/16/2020 Last OV with Owens Loffler, MD  F/u R shoulder impingement: Home exercises and altered activity.  Right shoulder is predominantly improved and better, but she still does have some pain with an arc of abduction.  She does have still a mild T-shirt distribution of pain.  Some modest pain with internal range of motion.  Primary concern at this point is right-sided lateral epicondylitis.    Length of symptoms: 1-2 mo Hand effected: R  Patient describes a dull ache on the lateral elbow. There is some translation in the proximal forearm and in the distal upper arm. It is painful to lift with the  hand facing down and to lift with the thumb in an upright position. Supination is painful. Patient points to the lateral epicondyle as the point of maximal tenderness near ECRB.  No trauma.   No prior fractures or operative interventions in the effective hand. Prior PT or HEP: none  Denies numbness or tingling. No significant neck pain..  Review of Systems is noted in the HPI, as appropriate   Objective:   BP 100/60    Pulse (!) 49    Temp 98.5 F (36.9 C) (Temporal)    Ht 5' 4.5" (1.638 m)    Wt 168 lb 8 oz (76.4 kg)    LMP 07/07/2020    SpO2 95%    BMI 28.48 kg/m    GEN: No acute distress; alert,appropriate. PULM: Breathing comfortably in no respiratory distress PSYCH: Normally interactive.   Elbow: R Ecchymosis or edema: neg ROM: full flexion, extension, pronation, supination Shoulder ROM: Full Flexion: 5/5 Extension: 5/5, PAINFUL Supination: 5/5, PAINFUL Pronation: 5/5 Wrist ext: 5/5 Wrist flexion: 5/5 No gross bony abnormality Varus and Valgus stress: stable ECRB tenderness: YES, TTP Medial epicondyle: NT Lateral epicondyle, resisted wrist extension from wrist full pronation and flexion: PAINFUL grip: 5/5  sensation intact Tinel's, Elbow: negative  Full range of motion at the right shoulder.  Strength is 5/5.  Nontender at the [facility] joint or in the bicipital groove.  She does have a mildly positive Jobe test as well as a  mildly positive Michel Bickers test and minimal positive Neer test.  Radiology: No results found.  Assessment and Plan:     ICD-10-CM   1. Lateral epicondylitis of right elbow  M77.11 Ambulatory referral to Physical Therapy  2. Impingement syndrome of right shoulder  M75.41 Ambulatory referral to Physical Therapy   Classic tennis elbow.  I think that she will do better if she has some directed face-to-face physical therapy with some manual techniques as well.  Dedicated home rehab will be an essential part of her recovery.  Mild  impingement and rotator cuff tendinopathy.  Rotator cuff strengthening and scapular stabilization is classic conservative care, and I would anticipate that she will do okay.  My thought was that a injection number lateral epicondyle would probably provide her some immediate relief, but at this point she wants to try physical therapy alone.  No orders of the defined types were placed in this encounter.  Medications Discontinued During This Encounter  Medication Reason   predniSONE (DELTASONE) 20 MG tablet Completed Course   Polypodium Leucotomos (HELIOCARE) 240 MG CAPS Completed Course   Orders Placed This Encounter  Procedures   Ambulatory referral to Physical Therapy    Follow-up: 6 weeks  Signed,  Edder Bellanca T. Teondra Newburg, MD   Outpatient Encounter Medications as of 07/18/2020  Medication Sig   cetirizine (ZYRTEC) 10 MG tablet Take 10 mg by mouth daily.   Cholecalciferol (VITAMIN D3) 10 MCG (400 UNIT) tablet Take 400 Units by mouth daily.   sertraline (ZOLOFT) 50 MG tablet TAKE 1 TABLET BY MOUTH EVERYDAY AT BEDTIME   Specialty Vitamins Products (MAGNESIUM, AMINO ACID CHELATE,) 133 MG tablet Take 1 tablet by mouth 2 (two) times daily.   [DISCONTINUED] Polypodium Leucotomos (HELIOCARE) 240 MG CAPS  (Patient not taking: Reported on 06/27/2020)   [DISCONTINUED] predniSONE (DELTASONE) 20 MG tablet 2 tabs po daily for 5 days, then 1 tab po daily for 5 days (Patient not taking: Reported on 06/27/2020)   No facility-administered encounter medications on file as of 07/18/2020.

## 2020-07-20 ENCOUNTER — Other Ambulatory Visit: Payer: Self-pay | Admitting: Family

## 2020-07-20 DIAGNOSIS — R7989 Other specified abnormal findings of blood chemistry: Secondary | ICD-10-CM

## 2020-07-26 ENCOUNTER — Ambulatory Visit
Admission: RE | Admit: 2020-07-26 | Discharge: 2020-07-26 | Disposition: A | Discharge status: Home / Self Care | Payer: BC Managed Care – PPO | Source: Ambulatory Visit | Attending: Family | Admitting: Family

## 2020-07-26 ENCOUNTER — Other Ambulatory Visit: Payer: Self-pay

## 2020-07-26 DIAGNOSIS — Z0000 Encounter for general adult medical examination without abnormal findings: Secondary | ICD-10-CM | POA: Diagnosis not present

## 2020-07-26 DIAGNOSIS — Z1231 Encounter for screening mammogram for malignant neoplasm of breast: Secondary | ICD-10-CM | POA: Insufficient documentation

## 2020-08-07 DIAGNOSIS — M7711 Lateral epicondylitis, right elbow: Secondary | ICD-10-CM | POA: Diagnosis not present

## 2020-08-21 DIAGNOSIS — M7711 Lateral epicondylitis, right elbow: Secondary | ICD-10-CM | POA: Diagnosis not present

## 2020-08-28 DIAGNOSIS — M7711 Lateral epicondylitis, right elbow: Secondary | ICD-10-CM | POA: Diagnosis not present

## 2020-08-29 ENCOUNTER — Ambulatory Visit: Payer: BC Managed Care – PPO | Admitting: Family Medicine

## 2020-10-29 ENCOUNTER — Encounter: Payer: Self-pay | Admitting: Family

## 2020-10-30 ENCOUNTER — Other Ambulatory Visit: Payer: BC Managed Care – PPO

## 2020-10-30 DIAGNOSIS — Z20822 Contact with and (suspected) exposure to covid-19: Secondary | ICD-10-CM | POA: Diagnosis not present

## 2020-10-31 ENCOUNTER — Other Ambulatory Visit: Payer: BC Managed Care – PPO

## 2020-11-01 LAB — SPECIMEN STATUS REPORT

## 2020-11-01 LAB — NOVEL CORONAVIRUS, NAA: SARS-CoV-2, NAA: DETECTED — AB

## 2020-11-01 LAB — SARS-COV-2, NAA 2 DAY TAT

## 2020-11-12 ENCOUNTER — Ambulatory Visit: Payer: BC Managed Care – PPO

## 2021-05-20 ENCOUNTER — Encounter: Payer: Self-pay | Admitting: Family

## 2021-05-20 ENCOUNTER — Other Ambulatory Visit: Payer: Self-pay

## 2021-05-20 ENCOUNTER — Ambulatory Visit (INDEPENDENT_AMBULATORY_CARE_PROVIDER_SITE_OTHER): Payer: BC Managed Care – PPO | Admitting: Family

## 2021-05-20 ENCOUNTER — Other Ambulatory Visit: Payer: Self-pay | Admitting: Family

## 2021-05-20 ENCOUNTER — Telehealth: Payer: Self-pay | Admitting: Family

## 2021-05-20 VITALS — BP 120/82 | HR 57 | Temp 97.7°F | Ht 64.49 in | Wt 172.6 lb

## 2021-05-20 DIAGNOSIS — Z0000 Encounter for general adult medical examination without abnormal findings: Secondary | ICD-10-CM | POA: Diagnosis not present

## 2021-05-20 DIAGNOSIS — F419 Anxiety disorder, unspecified: Secondary | ICD-10-CM | POA: Diagnosis not present

## 2021-05-20 DIAGNOSIS — F32A Depression, unspecified: Secondary | ICD-10-CM

## 2021-05-20 DIAGNOSIS — E538 Deficiency of other specified B group vitamins: Secondary | ICD-10-CM

## 2021-05-20 LAB — CBC WITH DIFFERENTIAL/PLATELET
Basophils Absolute: 0 10*3/uL (ref 0.0–0.1)
Basophils Relative: 0.8 % (ref 0.0–3.0)
Eosinophils Absolute: 0.1 10*3/uL (ref 0.0–0.7)
Eosinophils Relative: 2.7 % (ref 0.0–5.0)
HCT: 38 % (ref 36.0–46.0)
Hemoglobin: 12.6 g/dL (ref 12.0–15.0)
Lymphocytes Relative: 28.6 % (ref 12.0–46.0)
Lymphs Abs: 1.5 10*3/uL (ref 0.7–4.0)
MCHC: 33.2 g/dL (ref 30.0–36.0)
MCV: 88.2 fl (ref 78.0–100.0)
Monocytes Absolute: 0.5 10*3/uL (ref 0.1–1.0)
Monocytes Relative: 9 % (ref 3.0–12.0)
Neutro Abs: 3.1 10*3/uL (ref 1.4–7.7)
Neutrophils Relative %: 58.9 % (ref 43.0–77.0)
Platelets: 268 10*3/uL (ref 150.0–400.0)
RBC: 4.31 Mil/uL (ref 3.87–5.11)
RDW: 13.8 % (ref 11.5–15.5)
WBC: 5.2 10*3/uL (ref 4.0–10.5)

## 2021-05-20 LAB — LIPID PANEL
Cholesterol: 223 mg/dL — ABNORMAL HIGH (ref 0–200)
HDL: 75 mg/dL (ref >39.00)
LDL Cholesterol: 137 mg/dL — ABNORMAL HIGH (ref 0–99)
NonHDL: 147.92
Total CHOL/HDL Ratio: 3
Triglycerides: 54 mg/dL (ref 0.0–149.0)
VLDL: 10.8 mg/dL (ref 0.0–40.0)

## 2021-05-20 LAB — B12 AND FOLATE PANEL
Folate: 8.9 ng/mL (ref >5.9)
Vitamin B-12: 188 pg/mL — ABNORMAL LOW (ref 211–911)

## 2021-05-20 LAB — COMPREHENSIVE METABOLIC PANEL
ALT: 10 U/L (ref 0–35)
AST: 15 U/L (ref 0–37)
Albumin: 4.5 g/dL (ref 3.5–5.2)
Alkaline Phosphatase: 43 U/L (ref 39–117)
BUN: 16 mg/dL (ref 6–23)
CO2: 28 mEq/L (ref 19–32)
Calcium: 9.5 mg/dL (ref 8.4–10.5)
Chloride: 101 mEq/L (ref 96–112)
Creatinine, Ser: 0.8 mg/dL (ref 0.40–1.20)
GFR: 88.9 mL/min (ref >60.00)
Glucose, Bld: 85 mg/dL (ref 70–99)
Potassium: 4.3 mEq/L (ref 3.5–5.1)
Sodium: 136 mEq/L (ref 135–145)
Total Bilirubin: 0.6 mg/dL (ref 0.2–1.2)
Total Protein: 7.1 g/dL (ref 6.0–8.3)

## 2021-05-20 LAB — HEMOGLOBIN A1C: Hgb A1c MFr Bld: 5.4 % (ref 4.6–6.5)

## 2021-05-20 LAB — TSH: TSH: 2.8 u[IU]/mL (ref 0.35–5.50)

## 2021-05-20 LAB — VITAMIN D 25 HYDROXY (VIT D DEFICIENCY, FRACTURES): VITD: 46.05 ng/mL (ref 30.00–100.00)

## 2021-05-20 MED ORDER — SERTRALINE HCL 25 MG PO TABS: 25.0000 mg | ORAL_TABLET | Freq: Every day | ORAL | 1 refills | Status: DC

## 2021-05-20 NOTE — Progress Notes (Signed)
Subjective:    Patient ID: Heather Fisher, female    DOB: 1975-11-21, 45 y.o.   MRN: [id number]  CC: Heather Fisher is a 45 y.o. female who presents today for physical exam.    HPI: Anxiety and depression-compliant with Zoloft '50mg'$ .  Overall feels that anxiety depression is well controlled.  She is felt like she has had a harder time losing weight since being on Zoloft.  She is interested in decreasing this medication.  Sleeping well. No HA.   Menses are monthly and not heavy.   Mother h/o skin cancer. She follows with Dr Laurence Ferrari.     Colorectal Cancer Screening: due Breast Cancer Screening: Mammogram UTD Cervical Cancer Screening: UTD, negative HPV, normal cytology. 1 year ago Lung Cancer Screening: Doesn't have 20 year pack year history and age > 68 years yo 42 years        Tetanus - UTD Labs: Screening labs today. Exercise: Gets regular exercise.   Alcohol use:  none Smoking/tobacco use: former smoker.     HISTORY:  Past Medical History:  Diagnosis Date   Asthma    Chicken pox    Clotting disorder (HCC)    Factor 5 Liden   Hay fever    Heart murmur    functional   Scoliosis     Past Surgical History:  Procedure Laterality Date   CESAREAN SECTION     2004. 2008   tonsil     WISDOM TOOTH EXTRACTION Bilateral    Family History  Problem Relation Age of Onset   Arthritis Mother    Skin cancer Mother    Arthritis Maternal Grandmother    Heart disease Maternal Grandmother    Hypertension Maternal Grandmother    Cancer Maternal Grandmother 80       breast   Breast cancer Maternal Grandmother 80   Heart disease Paternal Grandmother    Diabetes Paternal Grandmother    Heart murmur Father    Colon cancer Neg Hx       ALLERGIES: Penicillins and Sulfonamide derivatives  Current Outpatient Medications on File Prior to Visit  Medication Sig Dispense Refill   cetirizine (ZYRTEC) 10 MG tablet Take 10 mg by mouth daily.     Cholecalciferol (VITAMIN D3) 10 MCG (400  UNIT) tablet Take 400 Units by mouth daily.     sertraline (ZOLOFT) 50 MG tablet TAKE 1 TABLET BY MOUTH EVERYDAY AT BEDTIME 90 tablet 3   Specialty Vitamins Products (MAGNESIUM, AMINO ACID CHELATE,) 133 MG tablet Take 1 tablet by mouth daily.     No current facility-administered medications on file prior to visit.    Social History   Tobacco Use   Smoking status: Former    Packs/day: 0.25    Types: Cigarettes    Quit date: 04/20/2016    Years since quitting: 5.0   Smokeless tobacco: Never   Tobacco comments:    Smokes a couple per day.  Substance Use Topics   Alcohol use: No    Alcohol/week: 0.0 standard drinks    Comment: Rarely    Drug use: No    Review of Systems  Constitutional:  Negative for chills, fever and unexpected weight change.  HENT:  Negative for congestion.   Respiratory:  Negative for cough.   Cardiovascular:  Negative for chest pain, palpitations and leg swelling.  Gastrointestinal:  Negative for nausea and vomiting.  Musculoskeletal:  Negative for arthralgias and myalgias.  Skin:  Negative for rash.  Neurological:  Negative for  headaches.  Hematological:  Negative for adenopathy.  Psychiatric/Behavioral:  Negative for confusion and sleep disturbance. The patient is not nervous/anxious.      Objective:    BP 120/82   Pulse (!) 57   Temp 97.7 F (36.5 C)   Ht 5' 4.49" (1.638 m)   Wt 172 lb 9.6 oz (78.3 kg)   LMP 05/19/2021   SpO2 98%   BMI 29.18 kg/m   BP Readings from Last 3 Encounters:  05/20/21 120/82  07/18/20 100/60  05/17/20 114/76   Wt Readings from Last 3 Encounters:  05/20/21 172 lb 9.6 oz (78.3 kg)  07/18/20 168 lb 8 oz (76.4 kg)  05/17/20 171 lb (77.6 kg)    Physical Exam Vitals reviewed.  Constitutional:      Appearance: Normal appearance. She is well-developed.  Eyes:     Conjunctiva/sclera: Conjunctivae normal.  Neck:     Thyroid: No thyroid mass or thyromegaly.  Cardiovascular:     Rate and Rhythm: Normal rate and  regular rhythm.     Pulses: Normal pulses.     Heart sounds: Normal heart sounds.  Pulmonary:     Effort: Pulmonary effort is normal.     Breath sounds: Normal breath sounds. No wheezing, rhonchi or rales.  Chest:  Breasts:    Breasts are symmetrical.     Right: No inverted nipple, mass, nipple discharge, skin change or tenderness.     Left: No inverted nipple, mass, nipple discharge, skin change or tenderness.  Abdominal:     General: Bowel sounds are normal. There is no distension.     Palpations: Abdomen is soft. Abdomen is not rigid. There is no fluid wave or mass.     Tenderness: There is no abdominal tenderness. There is no guarding or rebound.  Lymphadenopathy:     Head:     Right side of head: No submental, submandibular, tonsillar, preauricular, posterior auricular or occipital adenopathy.     Left side of head: No submental, submandibular, tonsillar, preauricular, posterior auricular or occipital adenopathy.     Cervical: No cervical adenopathy.     Right cervical: No superficial, deep or posterior cervical adenopathy.    Left cervical: No superficial, deep or posterior cervical adenopathy.  Skin:    General: Skin is warm and dry.  Neurological:     Mental Status: She is alert.  Psychiatric:        Speech: Speech normal.        Behavior: Behavior normal.        Thought Content: Thought content normal.       Assessment & Plan:   Problem List Items Addressed This Visit   None   I am having Heather Fisher maintain her magnesium (amino acid chelate), cetirizine, Vitamin D3, and sertraline.   No orders of the defined types were placed in this encounter.   Return precautions given.   Risks, benefits, and alternatives of the medications and treatment plan prescribed today were discussed, and patient expressed understanding.   Education regarding symptom management and diagnosis given to patient on AVS.   Continue to follow with Burnard Hawthorne, FNP for  routine health maintenance.   Heather Fisher and I agreed with plan.   Mable Paris, FNP

## 2021-05-20 NOTE — Assessment & Plan Note (Signed)
Chronic, stable.  We agreed to decrease Zoloft to 25 mg to see if she was able to lose weight, maintain weight easier.  We also discussed considering Wellbutrin either as replacement or as an adjunct to Zoloft.  She will let me know how she is doing

## 2021-05-20 NOTE — Patient Instructions (Addendum)
Start zoloft '25mg'$    For post menopausal women, guidelines recommend a diet with 1200 mg of Calcium per day. If you are eating calcium rich foods, you do not need a calcium supplement. The body better absorbs the calcium that you eat over supplementation. If you do supplement, I recommend not supplementing the full 1200 mg/ day as this can lead to increased risk of cardiovascular disease. I recommend Calcium Citrate over the counter, and you may take a total of 600 to 800 mg per day in divided doses with meals for best absorption.   For bone health, you need adequate vitamin D, and I recommend you supplement as it is harder to do so with diet alone. I recommend cholecalciferol 800 units daily.  Also, please ensure you are following a diet high in calcium -- research shows better outcomes with dietary sources including kale, yogurt, broccolii, cheese, okra, almonds- to name a few.     Also remember that exercise is a great medicine for maintain and preserve bone health. Advise moderate exercise for 30 minutes , 3 times per week.   Health Maintenance, Female Adopting a healthy lifestyle and getting preventive care are important in promoting health and wellness. Ask your health care provider about: The right schedule for you to have regular tests and exams. Things you can do on your own to prevent diseases and keep yourself healthy. What should I know about diet, weight, and exercise? Eat a healthy diet  Eat a diet that includes plenty of vegetables, fruits, low-fat dairy products, and lean protein. Do not eat a lot of foods that are high in solid fats, added sugars, or sodium.  Maintain a healthy weight Body mass index (BMI) is used to identify weight problems. It estimates body fat based on height and weight. Your health care provider can help determineyour BMI and help you achieve or maintain a healthy weight. Get regular exercise Get regular exercise. This is one of the most important things  you can do for your health. Most adults should: Exercise for at least 150 minutes each week. The exercise should increase your heart rate and make you sweat (moderate-intensity exercise). Do strengthening exercises at least twice a week. This is in addition to the moderate-intensity exercise. Spend less time sitting. Even light physical activity can be beneficial. Watch cholesterol and blood lipids Have your blood tested for lipids and cholesterol at 45 years of age, then havethis test every 5 years. Have your cholesterol levels checked more often if: Your lipid or cholesterol levels are high. You are older than 45 years of age. You are at high risk for heart disease. What should I know about cancer screening? Depending on your health history and family history, you may need to have cancer screening at various ages. This may include screening for: Breast cancer. Cervical cancer. Colorectal cancer. Skin cancer. Lung cancer. What should I know about heart disease, diabetes, and high blood pressure? Blood pressure and heart disease High blood pressure causes heart disease and increases the risk of stroke. This is more likely to develop in people who have high blood pressure readings, are of African descent, or are overweight. Have your blood pressure checked: Every 3-5 years if you are 48-63 years of age. Every year if you are 55 years old or older. Diabetes Have regular diabetes screenings. This checks your fasting blood sugar level. Have the screening done: Once every three years after age 58 if you are at a normal weight and have a  low risk for diabetes. More often and at a younger age if you are overweight or have a high risk for diabetes. What should I know about preventing infection? Hepatitis B If you have a higher risk for hepatitis B, you should be screened for this virus. Talk with your health care provider to find out if you are at risk forhepatitis B infection. Hepatitis  C Testing is recommended for: Everyone born from [birth year] through 1965. Anyone with known risk factors for hepatitis C. Sexually transmitted infections (STIs) Get screened for STIs, including gonorrhea and chlamydia, if: You are sexually active and are younger than 45 years of age. You are older than 45 years of age and your health care provider tells you that you are at risk for this type of infection. Your sexual activity has changed since you were last screened, and you are at increased risk for chlamydia or gonorrhea. Ask your health care provider if you are at risk. Ask your health care provider about whether you are at high risk for HIV. Your health care provider may recommend a prescription medicine to help prevent HIV infection. If you choose to take medicine to prevent HIV, you should first get tested for HIV. You should then be tested every 3 months for as long as you are taking the medicine. Pregnancy If you are about to stop having your period (premenopausal) and you may become pregnant, seek counseling before you get pregnant. Take 400 to 800 micrograms (mcg) of folic acid every day if you become pregnant. Ask for birth control (contraception) if you want to prevent pregnancy. Osteoporosis and menopause Osteoporosis is a disease in which the bones lose minerals and strength with aging. This can result in bone fractures. If you are 64 years old or older, or if you are at risk for osteoporosis and fractures, ask your health care provider if you should: Be screened for bone loss. Take a calcium or vitamin D supplement to lower your risk of fractures. Be given hormone replacement therapy (HRT) to treat symptoms of menopause. Follow these instructions at home: Lifestyle Do not use any products that contain nicotine or tobacco, such as cigarettes, e-cigarettes, and chewing tobacco. If you need help quitting, ask your health care provider. Do not use street drugs. Do not share needles. Ask  your health care provider for help if you need support or information about quitting drugs. Alcohol use Do not drink alcohol if: Your health care provider tells you not to drink. You are pregnant, may be pregnant, or are planning to become pregnant. If you drink alcohol: Limit how much you use to 0-1 drink a day. Limit intake if you are breastfeeding. Be aware of how much alcohol is in your drink. In the [place] one drink equals one 12 oz bottle of beer (355 mL), one 5 oz glass of wine (148 mL), or one 1 oz glass of hard liquor (44 mL). General instructions Schedule regular health, dental, and eye exams. Stay current with your vaccines. Tell your health care provider if: You often feel depressed. You have ever been abused or do not feel safe at home. Summary Adopting a healthy lifestyle and getting preventive care are important in promoting health and wellness. Follow your health care provider's instructions about healthy diet, exercising, and getting tested or screened for diseases. Follow your health care provider's instructions on monitoring your cholesterol and blood pressure. This information is not intended to replace advice given to you by your health care provider. Make  sure you discuss any questions you have with your healthcare provider. Document Revised: 10/06/2018 Document Reviewed: 10/06/2018 Elsevier Patient Education  2022 [employer].

## 2021-05-20 NOTE — Telephone Encounter (Signed)
Lft pt vm to call ofc regarding the referral to gastro.thanks

## 2021-05-20 NOTE — Assessment & Plan Note (Signed)
Clinical breast exam performed today.  Mammogram is scheduled.  Ordered colonoscopy.  Encourage continued exercise

## 2021-05-23 ENCOUNTER — Other Ambulatory Visit: Payer: Self-pay

## 2021-05-23 ENCOUNTER — Ambulatory Visit (INDEPENDENT_AMBULATORY_CARE_PROVIDER_SITE_OTHER): Payer: BC Managed Care – PPO

## 2021-05-23 DIAGNOSIS — E538 Deficiency of other specified B group vitamins: Secondary | ICD-10-CM

## 2021-05-23 MED ORDER — CYANOCOBALAMIN 1000 MCG/ML IJ SOLN
1000.0000 ug | Freq: Once | INTRAMUSCULAR | Status: AC
Start: 2021-05-23 — End: 2021-05-23
  Administered 2021-05-23: 1000 ug via INTRAMUSCULAR

## 2021-05-23 NOTE — Progress Notes (Signed)
Patient presented for B 12 injection to left deltoid, patient voiced no concerns nor showed any signs of distress during injection. 

## 2021-05-30 ENCOUNTER — Other Ambulatory Visit: Payer: Self-pay

## 2021-05-30 ENCOUNTER — Other Ambulatory Visit (INDEPENDENT_AMBULATORY_CARE_PROVIDER_SITE_OTHER): Payer: BC Managed Care – PPO

## 2021-05-30 ENCOUNTER — Ambulatory Visit (INDEPENDENT_AMBULATORY_CARE_PROVIDER_SITE_OTHER): Payer: BC Managed Care – PPO

## 2021-05-30 DIAGNOSIS — E538 Deficiency of other specified B group vitamins: Secondary | ICD-10-CM | POA: Diagnosis not present

## 2021-05-30 MED ORDER — CYANOCOBALAMIN 1000 MCG/ML IJ SOLN
1000.0000 ug | Freq: Once | INTRAMUSCULAR | Status: AC
  Administered 2021-05-30: 1000 ug via INTRAMUSCULAR

## 2021-05-30 NOTE — Progress Notes (Signed)
Patient presented for B 12 injection to left deltoid, patient voiced no concerns nor showed any signs of distress during injection. 

## 2021-05-30 NOTE — Addendum Note (Signed)
Addended by: Leeanne Rio on: 05/30/2021 09:16 AM   Modules accepted: Orders

## 2021-06-05 LAB — HOMOCYSTEINE: Homocysteine: 7.5 umol/L (ref 0.0–14.5)

## 2021-06-05 LAB — CELIAC DISEASE AB SCREEN W/RFX
Antigliadin Abs, IgA: 5 units (ref 0–19)
IgA/Immunoglobulin A, Serum: 217 mg/dL (ref 87–352)
Transglutaminase IgA: <2 U/mL (ref 0–3)

## 2021-06-05 LAB — ANTI-PARIETAL ANTIBODY: Parietal Cell Ab: 1.2 Units (ref 0.0–20.0)

## 2021-06-05 LAB — METHYLMALONIC ACID, SERUM: Methylmalonic Acid: 135 nmol/L (ref 0–378)

## 2021-06-05 LAB — INTRINSIC FACTOR ANTIBODIES: Intrinsic Factor Abs, Serum: 1.1 AU/mL (ref 0.0–1.1)

## 2021-06-06 ENCOUNTER — Encounter: Payer: Self-pay | Admitting: Family

## 2021-06-06 ENCOUNTER — Ambulatory Visit (INDEPENDENT_AMBULATORY_CARE_PROVIDER_SITE_OTHER): Payer: BC Managed Care – PPO

## 2021-06-06 ENCOUNTER — Ambulatory Visit: Payer: BC Managed Care – PPO

## 2021-06-06 ENCOUNTER — Other Ambulatory Visit: Payer: Self-pay

## 2021-06-06 DIAGNOSIS — E538 Deficiency of other specified B group vitamins: Secondary | ICD-10-CM | POA: Diagnosis not present

## 2021-06-06 MED ORDER — CYANOCOBALAMIN 1000 MCG/ML IJ SOLN
1000.0000 ug | Freq: Once | INTRAMUSCULAR | Status: AC
  Administered 2021-06-06: 1000 ug via INTRAMUSCULAR

## 2021-06-06 NOTE — Progress Notes (Signed)
Patient presented for B 12 injection to left deltoid, patient voiced no concerns nor showed any signs of distress during injection. 

## 2021-06-13 ENCOUNTER — Other Ambulatory Visit: Payer: Self-pay

## 2021-06-13 ENCOUNTER — Encounter: Payer: Self-pay | Admitting: Family

## 2021-06-13 ENCOUNTER — Ambulatory Visit (INDEPENDENT_AMBULATORY_CARE_PROVIDER_SITE_OTHER): Payer: BC Managed Care – PPO

## 2021-06-13 DIAGNOSIS — E538 Deficiency of other specified B group vitamins: Secondary | ICD-10-CM | POA: Diagnosis not present

## 2021-06-13 MED ORDER — CYANOCOBALAMIN 1000 MCG/ML IJ SOLN
1000.0000 ug | Freq: Once | INTRAMUSCULAR | Status: AC
  Administered 2021-06-13: 1000 ug via INTRAMUSCULAR

## 2021-06-13 NOTE — Progress Notes (Signed)
Patient presented for B 12 injection to left deltoid, patient voiced no concerns nor showed any signs of distress during injection. 

## 2021-06-14 ENCOUNTER — Other Ambulatory Visit: Payer: Self-pay | Admitting: Family

## 2021-06-18 NOTE — Telephone Encounter (Signed)
LMTCB

## 2021-06-18 NOTE — Telephone Encounter (Signed)
PT called to return the missed call and advise she can be reached at [phone]

## 2021-06-20 ENCOUNTER — Telehealth: Payer: Self-pay

## 2021-06-20 DIAGNOSIS — E538 Deficiency of other specified B group vitamins: Secondary | ICD-10-CM

## 2021-06-20 NOTE — Telephone Encounter (Signed)
Placed orders for B12 for future labs.

## 2021-06-20 NOTE — Telephone Encounter (Signed)
Placed call to schedule pt lab appt. LMTCB Lab orders placed.

## 2021-06-25 ENCOUNTER — Other Ambulatory Visit: Payer: Self-pay

## 2021-06-25 ENCOUNTER — Other Ambulatory Visit (INDEPENDENT_AMBULATORY_CARE_PROVIDER_SITE_OTHER): Payer: BC Managed Care – PPO

## 2021-06-25 DIAGNOSIS — E538 Deficiency of other specified B group vitamins: Secondary | ICD-10-CM | POA: Diagnosis not present

## 2021-06-25 LAB — VITAMIN B12: Vitamin B-12: 430 pg/mL (ref 211–911)

## 2021-07-03 ENCOUNTER — Encounter: Payer: BC Managed Care – PPO | Admitting: Dermatology

## 2021-07-30 ENCOUNTER — Other Ambulatory Visit: Payer: Self-pay

## 2021-07-30 ENCOUNTER — Ambulatory Visit
Admission: RE | Admit: 2021-07-30 | Discharge: 2021-07-30 | Disposition: A | Discharge status: Home / Self Care | Payer: BC Managed Care – PPO | Source: Ambulatory Visit | Attending: Family | Admitting: Family

## 2021-07-30 DIAGNOSIS — Z0000 Encounter for general adult medical examination without abnormal findings: Secondary | ICD-10-CM | POA: Diagnosis not present

## 2021-07-30 DIAGNOSIS — Z1231 Encounter for screening mammogram for malignant neoplasm of breast: Secondary | ICD-10-CM | POA: Insufficient documentation

## 2021-10-01 ENCOUNTER — Ambulatory Visit: Payer: BC Managed Care – PPO | Admitting: Dermatology

## 2021-10-01 ENCOUNTER — Other Ambulatory Visit: Payer: Self-pay

## 2021-10-01 ENCOUNTER — Encounter: Payer: Self-pay | Admitting: Dermatology

## 2021-10-01 DIAGNOSIS — L308 Other specified dermatitis: Secondary | ICD-10-CM | POA: Diagnosis not present

## 2021-10-01 DIAGNOSIS — Z1283 Encounter for screening for malignant neoplasm of skin: Secondary | ICD-10-CM | POA: Diagnosis not present

## 2021-10-01 DIAGNOSIS — L578 Other skin changes due to chronic exposure to nonionizing radiation: Secondary | ICD-10-CM

## 2021-10-01 DIAGNOSIS — L719 Rosacea, unspecified: Secondary | ICD-10-CM | POA: Diagnosis not present

## 2021-10-01 DIAGNOSIS — D1800 Hemangioma unspecified site: Secondary | ICD-10-CM

## 2021-10-01 DIAGNOSIS — L720 Epidermal cyst: Secondary | ICD-10-CM | POA: Diagnosis not present

## 2021-10-01 DIAGNOSIS — D229 Melanocytic nevi, unspecified: Secondary | ICD-10-CM

## 2021-10-01 DIAGNOSIS — B009 Herpesviral infection, unspecified: Secondary | ICD-10-CM

## 2021-10-01 DIAGNOSIS — L821 Other seborrheic keratosis: Secondary | ICD-10-CM

## 2021-10-01 DIAGNOSIS — L814 Other melanin hyperpigmentation: Secondary | ICD-10-CM

## 2021-10-01 MED ORDER — TRIAMCINOLONE ACETONIDE 0.1 % EX CREA: 1.0000 "application " | TOPICAL_CREAM | Freq: Two times a day (BID) | CUTANEOUS | 0 refills | Status: DC | PRN

## 2021-10-01 MED ORDER — VALACYCLOVIR HCL 1 G PO TABS: ORAL_TABLET | ORAL | 3 refills | Status: DC

## 2021-10-01 NOTE — Patient Instructions (Addendum)
Start Triamcinolone 0.1% cream or ointment twice a day to rash at chest for up to 2 weeks. Avoid applying to face, groin, and axilla.   Gentle Skin Care Guide  1. Bathe no more than once a day.  2. Avoid bathing in hot water  3. Use a mild soap like Dove, Vanicream, Cetaphil, CeraVe. Can use Lever 2000 or Cetaphil antibacterial soap  4. Use soap only where you need it. On most days, use it under your arms, between your legs, and on your feet. Let the water rinse other areas unless visibly dirty.  5. When you get out of the bath/shower, use a towel to gently blot your skin dry, don't rub it.  6. While your skin is still a little damp, apply a moisturizing cream such as Vanicream, CeraVe, Cetaphil, Eucerin, Sarna lotion or plain Vaseline Jelly. For hands apply Neutrogena [place] Hand Cream or Excipial Hand Cream.  7. Reapply moisturizer any time you start to itch or feel dry.  8. Sometimes using free and clear laundry detergents can be helpful. Fabric softener sheets should be avoided. Downy Free & Gentle liquid, or any liquid fabric softener that is free of dyes and perfumes, it acceptable to use  9. If your doctor has given you prescription creams you may apply moisturizers over them       If You Need Anything After Your Visit  If you have any questions or concerns for your doctor, please call our main line at [phone] and press option 4 to reach your doctor's medical assistant. If no one answers, please leave a voicemail as directed and we will return your call as soon as possible. Messages left after 4 pm will be answered the following business day.   You may also send [place] a message via [place]. We typically respond to MyChart messages within 1-2 business days.  For prescription refills, please ask your pharmacy to contact our office. Our fax number is [phone].  If you have an urgent issue when the clinic is closed that cannot wait until the next business day, you can  page your doctor at the number below.    Please note that while we do our best to be available for urgent issues outside of office hours, we are not available 24/7.   If you have an urgent issue and are unable to reach [place], you may choose to seek medical care at your doctor's office, retail clinic, urgent care center, or emergency room.  If you have a medical emergency, please immediately call 911 or go to the emergency department.  Pager Numbers  - Dr. Nehemiah Massed: [phone]  - Dr. Laurence Ferrari: [phone]  - Dr. Nicole Kindred: [phone]  In the event of inclement weather, please call our main line at [phone] for an update on the status of any delays or closures.  Dermatology Medication Tips: Please keep the boxes that topical medications come in in order to help keep track of the instructions about where and how to use these. Pharmacies typically print the medication instructions only on the boxes and not directly on the medication tubes.   If your medication is too expensive, please contact our office at [phone] option 4 or send [place] a message through [place].   We are unable to tell what your co-pay for medications will be in advance as this is different depending on your insurance coverage. However, we may be able to find a substitute medication at lower cost or fill out paperwork to get insurance to  cover a needed medication.   If a prior authorization is required to get your medication covered by your insurance company, please allow [place] 1-2 business days to complete this process.  Drug prices often vary depending on where the prescription is filled and some pharmacies may offer cheaper prices.  The website www.goodrx.com contains coupons for medications through different pharmacies. The prices here do not account for what the cost may be with help from insurance (it may be cheaper with your insurance), but the website can give you the price if you did not use any insurance.  - You  can print the associated coupon and take it with your prescription to the pharmacy.  - You may also stop by our office during regular business hours and pick up a GoodRx coupon card.  - If you need your prescription sent electronically to a different pharmacy, notify our office through [facility] or by phone at [phone] option 4.     Si Usted Necesita Algo Despus de Su Visita  Tambin puede enviarnos un mensaje a travs de Pharmacist, community. Por lo general respondemos a los mensajes de MyChart en el transcurso de 1 a 2 das hbiles.  Para renovar recetas, por favor pida a su farmacia que se ponga en contacto con nuestra oficina. Harland Dingwall de fax es Pablo Pena [phone].  Si tiene un asunto urgente cuando la clnica est cerrada y que no puede esperar hasta el siguiente da hbil, puede llamar/localizar a su doctor(a) al nmero que aparece a continuacin.   Por favor, tenga en cuenta que aunque hacemos todo lo posible para estar disponibles para asuntos urgentes fuera del horario de [place], no estamos disponibles las 24 horas del da, los 7 das de la [place].   Si tiene un problema urgente y no puede comunicarse con nosotros, puede optar por buscar atencin mdica  en el consultorio de su doctor(a), en una clnica privada, en un centro de atencin urgente o en una sala de emergencias.  Si tiene Engineering geologist, por favor llame inmediatamente al 911 o vaya a la sala de emergencias.  Nmeros de bper  - Dr. Nehemiah Massed: [phone]  - Dra. Jhovany Weidinger: [phone]  - Dra. Nicole Kindred: [phone]  En caso de inclemencias del [place], por favor llame a Johnsie Kindred principal al [phone] para una actualizacin sobre el [place] de cualquier retraso o cierre.  Consejos para la medicacin en dermatologa: Por favor, guarde las cajas en las que vienen los medicamentos de uso tpico para ayudarle a seguir las instrucciones sobre dnde y cmo usarlos. Las farmacias generalmente imprimen las  instrucciones del medicamento slo en las cajas y no directamente en los tubos del [place].   Si su medicamento es muy caro, por favor, pngase en contacto con Zigmund Daniel llamando al [phone] y presione la opcin 4 o envenos un mensaje a travs de Pharmacist, community.   No podemos decirle cul ser su copago por los medicamentos por adelantado ya que esto es diferente dependiendo de la cobertura de su seguro. Sin embargo, es posible que podamos encontrar un medicamento sustituto a Electrical engineer un formulario para que el seguro cubra el medicamento que se considera necesario.   Si se requiere una autorizacin previa para que su compaa de seguros [place] su medicamento, por favor permtanos de 1 a 2 das hbiles para completar este proceso.  Los precios de los medicamentos varan con frecuencia dependiendo del Environmental consultant de dnde se surte la receta y alguna farmacias pueden ofrecer precios ms  El sitio web www.goodrx.com tiene cupones para medicamentos de diferentes farmacias. Los precios aqu no tienen en cuenta lo que podra costar con la ayuda del seguro (puede ser ms barato con su seguro), pero el sitio web puede darle el precio si no utiliz ningn seguro.  - Puede imprimir el cupn correspondiente y llevarlo con su receta a la farmacia.  - Tambin puede pasar por nuestra oficina durante el horario de atencin regular y recoger una tarjeta de cupones de GoodRx.  - Si necesita que su receta se enve electrnicamente a una farmacia diferente, informe a nuestra oficina a travs de MyChart de [place] o por telfono llamando al [phone] y presione la opcin 4.  

## 2021-10-01 NOTE — Progress Notes (Signed)
Follow-Up Visit   Subjective  Heather Fisher is a 45 y.o. female who presents for the following: Annual Exam (Mole check ).  The patient presents for Total-Body Skin Exam (TBSE) for skin cancer screening and mole check.  The patient has spots, moles and lesions to be evaluated, some may be new or changing and the patient has concerns that these could be cancer.   The following portions of the chart were reviewed this encounter and updated as appropriate:   Tobacco  Allergies  Meds  Problems  Med Hx  Surg Hx  Fam Hx      Review of Systems:  No other skin or systemic complaints except as noted in HPI or Assessment and Plan.  Objective  Well appearing patient in no apparent distress; mood and affect are within normal limits.  A full examination was performed including scalp, head, eyes, ears, nose, lips, neck, chest, axillae, abdomen, back, buttocks, bilateral upper extremities, bilateral lower extremities, hands, feet, fingers, toes, fingernails, and toenails. All findings within normal limits unless otherwise noted below.  Left Upper Back Subcutaneous nodule.   upper chest Scaly pink papules coalescing to plaques   face Mid face erythema with telangiectasias   lower left lip Hx of fever blister once a month    Assessment & Plan  Epidermal inclusion cyst Left Upper Back  Benign-appearing. Exam most consistent with an epidermal inclusion cyst. Discussed that a cyst is a benign growth that can grow over time and sometimes get irritated or inflamed. Recommend observation if it is not bothersome. Discussed option of surgical excision to remove it if it is growing, symptomatic, or other changes noted. Please call for new or changing lesions so they can be evaluated.    Other eczema upper chest  Atopic dermatitis (eczema) is a chronic, relapsing, pruritic condition that can significantly affect quality of life. It is often associated with allergic rhinitis and/or asthma  and can require treatment with topical medications, phototherapy, or in severe cases biologic injectable medication (Dupixent; Adbry) or Oral JAK inhibitors.   Start Triamcinolone cream apply twice daily as needed to affected areas for two weeks then stop, Avoid applying to face, groin, and axilla.   Use as directed. Long-term use can cause thinning of the skin.  Topical steroids (such as triamcinolone, fluocinolone, fluocinonide, mometasone, clobetasol, halobetasol, betamethasone, hydrocortisone) can cause thinning and lightening of the skin if they are used for too long in the same area. Your physician has selected the right strength medicine for your problem and area affected on the body. Please use your medication only as directed by your physician to prevent side effects.    Pt already has Triamcinolone cream at home   Rosacea face  Rosacea is a chronic progressive skin condition usually affecting the face of adults, causing redness and/or acne bumps. It is treatable but not curable. It sometimes affects the eyes (ocular rosacea) as well. It may respond to topical and/or systemic medication and can flare with stress, sun exposure, alcohol, exercise and some foods.  Daily application of broad spectrum spf 30+ sunscreen to face is recommended to reduce flares.   Pt declines treatment. she will call here if she would like to try Metronidazole cream for bumps or Rhofade cream for redness   Herpes simplex lower left lip  Chronic condition with duration over one year. Condition is bothersome to patient. Currently flared.  Herpes Simplex Virus = Cold Sores = Fever Blisters is a chronic recurring blistering; scabbing  sore-producing viral infection that is recurrent usually in the same area triggered by stress, sun/UV exposure and trauma.  It is infectious and can be spread from person to person by direct contact.  It is not curable, but is treatable with topical and oral medication.   Discussed  treatment options including daily preventive therapy vs treatment when she has a cold sore starting  Start Valacyclovir 1gm  take 2 tablets at onset then 2 tablets 12 hours later  Related Medications valACYclovir (VALTREX) 1000 MG tablet Take 2 tablets at onset then take 2 tablets 12 hours later  Lentigines - Scattered tan macules - Due to sun exposure - Benign-appearing, observe - Recommend daily broad spectrum sunscreen SPF 30+ to sun-exposed areas, reapply every 2 hours as needed. - Call for any changes  Seborrheic Keratoses - Stuck-on, waxy, tan-brown papules and/or plaques  - Benign-appearing - Discussed benign etiology and prognosis. - Observe - Call for any changes  Melanocytic Nevi - Tan-brown and/or pink-flesh-colored symmetric macules and papules - Benign appearing on exam today - Observation - Call clinic for new or changing moles - Recommend daily use of broad spectrum spf 30+ sunscreen to sun-exposed areas.   Hemangiomas - Red papules - Discussed benign nature - Observe - Call for any changes  Actinic Damage - Chronic condition, secondary to cumulative UV/sun exposure - diffuse scaly erythematous macules with underlying dyspigmentation - Recommend daily broad spectrum sunscreen SPF 30+ to sun-exposed areas, reapply every 2 hours as needed.  - Staying in the shade or wearing long sleeves, sun glasses (UVA+UVB protection) and wide brim hats (4-inch brim around the entire circumference of the hat) are also recommended for sun protection.  - Call for new or changing lesions.  Skin cancer screening performed today.   Return in about 1 year (around 10/01/2022) for TBSE .  I, Marye Round, CMA, am acting as scribe for Forest Gleason, MD .   Documentation: I have reviewed the above documentation for accuracy and completeness, and I agree with the above.  Forest Gleason, MD

## 2021-10-31 ENCOUNTER — Encounter: Payer: BC Managed Care – PPO | Admitting: Dermatology

## 2021-12-13 ENCOUNTER — Other Ambulatory Visit: Payer: Self-pay | Admitting: Family

## 2021-12-13 DIAGNOSIS — F419 Anxiety disorder, unspecified: Secondary | ICD-10-CM

## 2021-12-13 DIAGNOSIS — F32A Depression, unspecified: Secondary | ICD-10-CM

## 2022-01-11 ENCOUNTER — Encounter: Payer: Self-pay | Admitting: Dermatology

## 2022-01-11 DIAGNOSIS — B009 Herpesviral infection, unspecified: Secondary | ICD-10-CM

## 2022-01-13 MED ORDER — VALACYCLOVIR HCL 1 G PO TABS: ORAL_TABLET | ORAL | 3 refills | Status: DC

## 2022-05-20 ENCOUNTER — Encounter: Payer: BC Managed Care – PPO | Admitting: Family

## 2022-05-26 ENCOUNTER — Encounter: Payer: Self-pay | Admitting: Family

## 2022-05-26 ENCOUNTER — Ambulatory Visit (INDEPENDENT_AMBULATORY_CARE_PROVIDER_SITE_OTHER): Payer: BC Managed Care – PPO | Admitting: Family

## 2022-05-26 VITALS — BP 122/74 | HR 59 | Temp 98.0°F | Ht 64.0 in | Wt 179.4 lb

## 2022-05-26 DIAGNOSIS — Z0000 Encounter for general adult medical examination without abnormal findings: Secondary | ICD-10-CM

## 2022-05-26 DIAGNOSIS — F32A Depression, unspecified: Secondary | ICD-10-CM | POA: Diagnosis not present

## 2022-05-26 DIAGNOSIS — F419 Anxiety disorder, unspecified: Secondary | ICD-10-CM | POA: Diagnosis not present

## 2022-05-26 LAB — COMPREHENSIVE METABOLIC PANEL
ALT: 11 U/L (ref 0–35)
AST: 14 U/L (ref 0–37)
Albumin: 4.6 g/dL (ref 3.5–5.2)
Alkaline Phosphatase: 46 U/L (ref 39–117)
BUN: 17 mg/dL (ref 6–23)
CO2: 28 mEq/L (ref 19–32)
Calcium: 9.8 mg/dL (ref 8.4–10.5)
Chloride: 102 mEq/L (ref 96–112)
Creatinine, Ser: 0.86 mg/dL (ref 0.40–1.20)
GFR: 80.93 mL/min (ref >60.00)
Glucose, Bld: 91 mg/dL (ref 70–99)
Potassium: 4.4 mEq/L (ref 3.5–5.1)
Sodium: 138 mEq/L (ref 135–145)
Total Bilirubin: 0.4 mg/dL (ref 0.2–1.2)
Total Protein: 7.6 g/dL (ref 6.0–8.3)

## 2022-05-26 LAB — CBC WITH DIFFERENTIAL/PLATELET
Basophils Absolute: 0.1 10*3/uL (ref 0.0–0.1)
Basophils Relative: 1.2 % (ref 0.0–3.0)
Eosinophils Absolute: 0.3 10*3/uL (ref 0.0–0.7)
Eosinophils Relative: 4.2 % (ref 0.0–5.0)
HCT: 39.8 % (ref 36.0–46.0)
Hemoglobin: 13.6 g/dL (ref 12.0–15.0)
Lymphocytes Relative: 30.9 % (ref 12.0–46.0)
Lymphs Abs: 1.9 10*3/uL (ref 0.7–4.0)
MCHC: 34.2 g/dL (ref 30.0–36.0)
MCV: 87.2 fl (ref 78.0–100.0)
Monocytes Absolute: 0.5 10*3/uL (ref 0.1–1.0)
Monocytes Relative: 7.6 % (ref 3.0–12.0)
Neutro Abs: 3.4 10*3/uL (ref 1.4–7.7)
Neutrophils Relative %: 56.1 % (ref 43.0–77.0)
Platelets: 265 10*3/uL (ref 150.0–400.0)
RBC: 4.56 Mil/uL (ref 3.87–5.11)
RDW: 13.6 % (ref 11.5–15.5)
WBC: 6.1 10*3/uL (ref 4.0–10.5)

## 2022-05-26 LAB — TSH: TSH: 3.26 u[IU]/mL (ref 0.35–5.50)

## 2022-05-26 LAB — LIPID PANEL
Cholesterol: 229 mg/dL — ABNORMAL HIGH (ref 0–200)
HDL: 74.7 mg/dL (ref >39.00)
LDL Cholesterol: 134 mg/dL — ABNORMAL HIGH (ref 0–99)
NonHDL: 153.98
Total CHOL/HDL Ratio: 3
Triglycerides: 100 mg/dL (ref 0.0–149.0)
VLDL: 20 mg/dL (ref 0.0–40.0)

## 2022-05-26 LAB — HEMOGLOBIN A1C: Hgb A1c MFr Bld: 5.5 % (ref 4.6–6.5)

## 2022-05-26 LAB — VITAMIN D 25 HYDROXY (VIT D DEFICIENCY, FRACTURES): VITD: 34.23 ng/mL (ref 30.00–100.00)

## 2022-05-26 MED ORDER — FLUOXETINE HCL 20 MG PO CAPS: 20.0000 mg | ORAL_CAPSULE | Freq: Every morning | ORAL | 3 refills | Status: DC

## 2022-05-26 NOTE — Assessment & Plan Note (Addendum)
Uncontrolled.  Discontinue Zoloft.  We agreed to start Prozac as it is more activating.  Patient will let me know how she is doing.

## 2022-05-26 NOTE — Progress Notes (Signed)
Subjective:    Patient ID: Heather Fisher, female    DOB: [birth year], 46 y.o.   MRN: [id number]  CC: Heather Fisher is a 46 y.o. female who presents today for physical exam.    HPI: Complains of increased anxiety and depression over the past several months.   No thoughts of hurting herself or anyone else.  Zoloft 25 mg has not been particularly helpful.  She feels like she is sleeping a lot.  She worries about her son who is starting college this fall.  She also complains of right breast tenderness which she noticed today.  She does also note that she lifted weights this morning.  She expects menses in a couple of days. No chest pain, left arm numbness, jaw pain, nipple inversion, breast mass.    She continues to follow with Dermatology annually  Colorectal Cancer Screening: due Breast Cancer Screening: Mammogram UTD  Cervical Cancer Screening: UTD, negative HPV, malignancy 05/17/2020  65 years or older who have ever smoked or have family history of AAA.        Tetanus - utd        Labs: Screening labs today. Exercise: Gets regular exercise.   Alcohol use:  rarely Smoking/tobacco use: former smoker.     HISTORY:  Past Medical History:  Diagnosis Date   Asthma    Chicken pox    Clotting disorder (HCC)    Factor 5 Liden   Hay fever    Heart murmur    functional   Scoliosis     Past Surgical History:  Procedure Laterality Date   CESAREAN SECTION     2004. 2008   tonsil     WISDOM TOOTH EXTRACTION Bilateral    Family History  Problem Relation Age of Onset   Arthritis Mother    Skin cancer Mother        BCC, SCC   Heart murmur Father    Arthritis Maternal Grandmother    Heart disease Maternal Grandmother    Hypertension Maternal Grandmother    Cancer Maternal Grandmother 80       breast   Breast cancer Maternal Grandmother 80   Heart disease Paternal Grandmother    Diabetes Paternal Grandmother    Colon cancer Neg Hx       ALLERGIES: Penicillins and  Sulfonamide derivatives  Current Outpatient Medications on File Prior to Visit  Medication Sig Dispense Refill   cetirizine (ZYRTEC) 10 MG tablet Take 10 mg by mouth daily.     Cholecalciferol (VITAMIN D3) 10 MCG (400 UNIT) tablet Take 400 Units by mouth daily.     Specialty Vitamins Products (MAGNESIUM, AMINO ACID CHELATE,) 133 MG tablet Take 1 tablet by mouth daily.     triamcinolone cream (KENALOG) 0.1 % Apply 1 application topically 2 (two) times daily as needed. For rash at chest. Avoid applying to face, groin, and axilla. 80 g 0   valACYclovir (VALTREX) 1000 MG tablet Take 2 tablets at onset then take 2 tablets 12 hours later 20 tablet 3   No current facility-administered medications on file prior to visit.    Social History   Tobacco Use   Smoking status: Former    Packs/day: 0.25    Types: Cigarettes    Quit date: 04/20/2016    Years since quitting: 6.1   Smokeless tobacco: Never   Tobacco comments:    Smokes a couple per day.  Substance Use Topics   Alcohol use: No    Alcohol/week:  0.0 standard drinks of alcohol    Comment: Rarely    Drug use: No    Review of Systems  Constitutional:  Negative for chills, fever and unexpected weight change.  HENT:  Negative for congestion.   Respiratory:  Negative for cough.   Cardiovascular:  Negative for chest pain, palpitations and leg swelling.  Gastrointestinal:  Negative for nausea and vomiting.  Musculoskeletal:  Negative for arthralgias and myalgias.  Skin:  Negative for rash.  Neurological:  Negative for headaches.  Hematological:  Negative for adenopathy.  Psychiatric/Behavioral:  Negative for confusion. The patient is nervous/anxious.       Objective:    BP 122/74 (BP Location: Left Arm, Patient Position: Sitting, Cuff Size: Large)   Pulse (!) 59   Temp 98 F (36.7 C) (Oral)   Ht '5\' 4"'$  (1.626 m)   Wt 179 lb 6.4 oz (81.4 kg)   LMP  (Exact Date)   SpO2 99%   BMI 30.79 kg/m   BP Readings from Last 3 Encounters:   05/26/22 122/74  05/20/21 120/82  07/18/20 100/60   Wt Readings from Last 3 Encounters:  05/26/22 179 lb 6.4 oz (81.4 kg)  05/20/21 172 lb 9.6 oz (78.3 kg)  07/18/20 168 lb 8 oz (76.4 kg)    Physical Exam Vitals reviewed.  Constitutional:      Appearance: Normal appearance. She is well-developed.  Eyes:     Conjunctiva/sclera: Conjunctivae normal.  Neck:     Thyroid: No thyroid mass or thyromegaly.  Cardiovascular:     Rate and Rhythm: Normal rate and regular rhythm.     Pulses: Normal pulses.     Heart sounds: Normal heart sounds.  Pulmonary:     Effort: Pulmonary effort is normal.     Breath sounds: Normal breath sounds. No wheezing, rhonchi or rales.  Chest:  Breasts:    Breasts are symmetrical.     Right: Tenderness (diffuse) present. No inverted nipple, mass, nipple discharge or skin change.     Left: No inverted nipple, mass, nipple discharge, skin change or tenderness.  Abdominal:     General: Bowel sounds are normal. There is no distension.     Palpations: Abdomen is soft. Abdomen is not rigid. There is no fluid wave or mass.     Tenderness: There is no abdominal tenderness. There is no guarding or rebound.  Lymphadenopathy:     Head:     Right side of head: No submental, submandibular, tonsillar, preauricular, posterior auricular or occipital adenopathy.     Left side of head: No submental, submandibular, tonsillar, preauricular, posterior auricular or occipital adenopathy.     Cervical: No cervical adenopathy.     Right cervical: No superficial, deep or posterior cervical adenopathy.    Left cervical: No superficial, deep or posterior cervical adenopathy.  Skin:    General: Skin is warm and dry.  Neurological:     Mental Status: She is alert.  Psychiatric:        Speech: Speech normal.        Behavior: Behavior normal.        Thought Content: Thought content normal.        Assessment & Plan:   Problem List Items Addressed This Visit       Other    Anxiety and depression    Uncontrolled.  Discontinue Zoloft.  We agreed to start Prozac as it is more activating.  Patient will let me know how she is doing.  Relevant Medications   FLUoxetine (PROZAC) 20 MG capsule   Routine physical examination - Primary    Deferred pelvic exam in the absence of complaints and Pap smear is up-to-date . CBE performed and right breast diffusely tender. Patient declines diagnostic imaging as suspects may be related to weight lifting , prior to menses. If tenderness persists, she agreed to call me so I may order diagnostic mammogram and ultrasound. Colonoscopy referral placed.       Relevant Orders   CBC with Differential/Platelet   Comprehensive metabolic panel   Hemoglobin A1c   Lipid panel   TSH   VITAMIN D 25 Hydroxy (Vit-D Deficiency, Fractures)   Ambulatory referral to Gastroenterology   MM 3D SCREEN BREAST BILATERAL     I have discontinued Erasmo Downer E. Bornhorst's sertraline. I am also having her start on FLUoxetine. Additionally, I am having her maintain her magnesium (amino acid chelate), cetirizine, Vitamin D3, triamcinolone cream, and valACYclovir.   Meds ordered this encounter  Medications   FLUoxetine (PROZAC) 20 MG capsule    Sig: Take 1 capsule (20 mg total) by mouth every morning.    Dispense:  90 capsule    Refill:  3    Order Specific Question:   Supervising Provider    Answer:   Crecencio Mc [[id number]]    Return precautions given.   Risks, benefits, and alternatives of the medications and treatment plan prescribed today were discussed, and patient expressed understanding.   Education regarding symptom management and diagnosis given to patient on AVS.   Continue to follow with Burnard Hawthorne, FNP for routine health maintenance.   Vardaman E Scalise and I agreed with plan.   Mable Paris, FNP

## 2022-05-26 NOTE — Patient Instructions (Addendum)
Tonight will be your last dose of Zoloft.  Please start Prozac in the morning.  Please let me know how you are doing and certainly we can increase this dose in the next couple of weeks.  If right breast tenderness continues, please let me know as well as I would like to order diagnostic imaging including right breast ultrasound  Health Maintenance, Female Adopting a healthy lifestyle and getting preventive care are important in promoting health and wellness. Ask your health care provider about: The right schedule for you to have regular tests and exams. Things you can do on your own to prevent diseases and keep yourself healthy. What should I know about diet, weight, and exercise? Eat a healthy diet  Eat a diet that includes plenty of vegetables, fruits, low-fat dairy products, and lean protein. Do not eat a lot of foods that are high in solid fats, added sugars, or sodium. Maintain a healthy weight Body mass index (BMI) is used to identify weight problems. It estimates body fat based on height and weight. Your health care provider can help determine your BMI and help you achieve or maintain a healthy weight. Get regular exercise Get regular exercise. This is one of the most important things you can do for your health. Most adults should: Exercise for at least 150 minutes each week. The exercise should increase your heart rate and make you sweat (moderate-intensity exercise). Do strengthening exercises at least twice a week. This is in addition to the moderate-intensity exercise. Spend less time sitting. Even light physical activity can be beneficial. Watch cholesterol and blood lipids Have your blood tested for lipids and cholesterol at 46 years of age, then have this test every 5 years. Have your cholesterol levels checked more often if: Your lipid or cholesterol levels are high. You are older than 46 years of age. You are at high risk for heart disease. What should I know about cancer  screening? Depending on your health history and family history, you may need to have cancer screening at various ages. This may include screening for: Breast cancer. Cervical cancer. Colorectal cancer. Skin cancer. Lung cancer. What should I know about heart disease, diabetes, and high blood pressure? Blood pressure and heart disease High blood pressure causes heart disease and increases the risk of stroke. This is more likely to develop in people who have high blood pressure readings or are overweight. Have your blood pressure checked: Every 3-5 years if you are 66-54 years of age. Every year if you are 12 years old or older. Diabetes Have regular diabetes screenings. This checks your fasting blood sugar level. Have the screening done: Once every three years after age 70 if you are at a normal weight and have a low risk for diabetes. More often and at a younger age if you are overweight or have a high risk for diabetes. What should I know about preventing infection? Hepatitis B If you have a higher risk for hepatitis B, you should be screened for this virus. Talk with your health care provider to find out if you are at risk for hepatitis B infection. Hepatitis C Testing is recommended for: Everyone born from [birth year] through 1965. Anyone with known risk factors for hepatitis C. Sexually transmitted infections (STIs) Get screened for STIs, including gonorrhea and chlamydia, if: You are sexually active and are younger than 46 years of age. You are older than 46 years of age and your health care provider tells you that you are at risk  for this type of infection. Your sexual activity has changed since you were last screened, and you are at increased risk for chlamydia or gonorrhea. Ask your health care provider if you are at risk. Ask your health care provider about whether you are at high risk for HIV. Your health care provider may recommend a prescription medicine to help prevent HIV  infection. If you choose to take medicine to prevent HIV, you should first get tested for HIV. You should then be tested every 3 months for as long as you are taking the medicine. Pregnancy If you are about to stop having your period (premenopausal) and you may become pregnant, seek counseling before you get pregnant. Take 400 to 800 micrograms (mcg) of folic acid every day if you become pregnant. Ask for birth control (contraception) if you want to prevent pregnancy. Osteoporosis and menopause Osteoporosis is a disease in which the bones lose minerals and strength with aging. This can result in bone fractures. If you are 39 years old or older, or if you are at risk for osteoporosis and fractures, ask your health care provider if you should: Be screened for bone loss. Take a calcium or vitamin D supplement to lower your risk of fractures. Be given hormone replacement therapy (HRT) to treat symptoms of menopause. Follow these instructions at home: Alcohol use Do not drink alcohol if: Your health care provider tells you not to drink. You are pregnant, may be pregnant, or are planning to become pregnant. If you drink alcohol: Limit how much you have to: 0-1 drink a day. Know how much alcohol is in your drink. In the [place] one drink equals one 12 oz bottle of beer (355 mL), one 5 oz glass of wine (148 mL), or one 1 oz glass of hard liquor (44 mL). Lifestyle Do not use any products that contain nicotine or tobacco. These products include cigarettes, chewing tobacco, and vaping devices, such as e-cigarettes. If you need help quitting, ask your health care provider. Do not use street drugs. Do not share needles. Ask your health care provider for help if you need support or information about quitting drugs. General instructions Schedule regular health, dental, and eye exams. Stay current with your vaccines. Tell your health care provider if: You often feel depressed. You have ever been abused  or do not feel safe at home. Summary Adopting a healthy lifestyle and getting preventive care are important in promoting health and wellness. Follow your health care provider's instructions about healthy diet, exercising, and getting tested or screened for diseases. Follow your health care provider's instructions on monitoring your cholesterol and blood pressure. This information is not intended to replace advice given to you by your health care provider. Make sure you discuss any questions you have with your health care provider. Document Revised: 03/04/2021 Document Reviewed: 03/04/2021 Elsevier Patient Education  [place].

## 2022-05-26 NOTE — Progress Notes (Signed)
Not fasting today. Patient would like to discuss depression medication? Maybe dosage change?

## 2022-05-26 NOTE — Assessment & Plan Note (Signed)
Deferred pelvic exam in the absence of complaints and Pap smear is up-to-date . CBE performed and right breast diffusely tender. Patient declines diagnostic imaging as suspects may be related to weight lifting , prior to menses. If tenderness persists, she agreed to call me so I may order diagnostic mammogram and ultrasound. Colonoscopy referral placed.

## 2022-05-28 ENCOUNTER — Telehealth: Payer: Self-pay

## 2022-05-28 NOTE — Telephone Encounter (Signed)
Returned patients call back to schedule her screening colonoscopy.  LVM for her to call me back to schedule.  Thanks,  [place], [place]

## 2022-05-28 NOTE — Telephone Encounter (Signed)
Returned patients call to schedule her screening colonoscopy. LVM for her to call back.  Thanks, [place], [place]

## 2022-05-29 ENCOUNTER — Other Ambulatory Visit: Payer: Self-pay

## 2022-05-29 ENCOUNTER — Telehealth: Payer: Self-pay

## 2022-05-29 DIAGNOSIS — Z1211 Encounter for screening for malignant neoplasm of colon: Secondary | ICD-10-CM

## 2022-05-29 MED ORDER — NA SULFATE-K SULFATE-MG SULF 17.5-3.13-1.6 GM/177ML PO SOLN: 1.0000 | Freq: Once | ORAL | 0 refills | Status: AC

## 2022-05-29 NOTE — Telephone Encounter (Signed)
Attempted to contact patient again to schedule her colonoscopy.  LVM for her to call me back and informed her that I would send her a mychart message she could   reply to the [place] message as well.  Thanks,  [place] , [place]

## 2022-06-19 ENCOUNTER — Other Ambulatory Visit: Payer: Self-pay | Admitting: Family

## 2022-06-19 DIAGNOSIS — F419 Anxiety disorder, unspecified: Secondary | ICD-10-CM

## 2022-06-27 ENCOUNTER — Encounter: Payer: Self-pay | Admitting: Gastroenterology

## 2022-06-27 ENCOUNTER — Ambulatory Visit: Payer: BC Managed Care – PPO | Admitting: Certified Registered"

## 2022-06-27 ENCOUNTER — Ambulatory Visit
Admission: RE | Admit: 2022-06-27 | Discharge: 2022-06-27 | Disposition: A | Discharge status: Home / Self Care | Payer: BC Managed Care – PPO | Attending: Gastroenterology | Admitting: Gastroenterology

## 2022-06-27 ENCOUNTER — Encounter
Admission: RE | Disposition: A | Discharge status: Home / Self Care | Payer: Self-pay | Source: Home / Self Care | Attending: Gastroenterology

## 2022-06-27 DIAGNOSIS — Z87891 Personal history of nicotine dependence: Secondary | ICD-10-CM | POA: Diagnosis not present

## 2022-06-27 DIAGNOSIS — Z1211 Encounter for screening for malignant neoplasm of colon: Secondary | ICD-10-CM | POA: Insufficient documentation

## 2022-06-27 HISTORY — PX: COLONOSCOPY WITH PROPOFOL: SHX5780

## 2022-06-27 HISTORY — DX: Anxiety disorder, unspecified: F41.9

## 2022-06-27 LAB — POCT PREGNANCY, URINE: Preg Test, Ur: NEGATIVE

## 2022-06-27 SURGERY — COLONOSCOPY WITH PROPOFOL
Anesthesia: General

## 2022-06-27 MED ORDER — SODIUM CHLORIDE 0.9 % IV SOLN: INTRAVENOUS | Status: DC

## 2022-06-27 MED ORDER — PROPOFOL 10 MG/ML IV BOLUS
INTRAVENOUS | Status: DC | PRN
  Administered 2022-06-27: 70 mg via INTRAVENOUS
  Administered 2022-06-27: 30 mg via INTRAVENOUS

## 2022-06-27 MED ORDER — PROPOFOL 500 MG/50ML IV EMUL
INTRAVENOUS | Status: DC | PRN
  Administered 2022-06-27: 120 ug/kg/min via INTRAVENOUS

## 2022-06-27 MED ORDER — MIDAZOLAM HCL 2 MG/2ML IJ SOLN
INTRAMUSCULAR | Status: AC
  Filled 2022-06-27: qty 2

## 2022-06-27 MED ORDER — MIDAZOLAM HCL 5 MG/5ML IJ SOLN
INTRAMUSCULAR | Status: DC | PRN
  Administered 2022-06-27: 2 mg via INTRAVENOUS

## 2022-06-27 MED ORDER — GLYCOPYRROLATE 0.2 MG/ML IJ SOLN
INTRAMUSCULAR | Status: DC | PRN
  Administered 2022-06-27: .2 mg via INTRAVENOUS

## 2022-06-27 MED ORDER — LIDOCAINE 2% (20 MG/ML) 5 ML SYRINGE
INTRAMUSCULAR | Status: DC | PRN
  Administered 2022-06-27: 20 mg via INTRAVENOUS

## 2022-06-27 NOTE — Anesthesia Preprocedure Evaluation (Signed)
Anesthesia Evaluation  Patient identified by MRN, date of birth, ID band Patient awake    Reviewed: Allergy & Precautions, NPO status , Patient's Chart, lab work & pertinent test results  Airway Mallampati: II  TM Distance: >3 FB Neck ROM: Full    Dental  (+) Teeth Intact   Pulmonary neg pulmonary ROS, Patient abstained from smoking., former smoker,    Pulmonary exam normal breath sounds clear to auscultation       Cardiovascular Exercise Tolerance: Good negative cardio ROS Normal cardiovascular exam Rhythm:Regular     Neuro/Psych Anxiety Depression negative neurological ROS  negative psych ROS   GI/Hepatic negative GI ROS, Neg liver ROS,   Endo/Other  negative endocrine ROS  Renal/GU negative Renal ROS  negative genitourinary   Musculoskeletal   Abdominal Normal abdominal exam  (+)   Peds negative pediatric ROS (+)  Hematology negative hematology ROS (+)   Anesthesia Other Findings Past Medical History: No date: Anxiety No date: Asthma No date: Chicken pox No date: Clotting disorder (HCC)     Comment:  Factor 5 Liden No date: Hay fever No date: Heart murmur     Comment:  functional No date: Scoliosis  Past Surgical History: No date: CESAREAN SECTION     Comment:  2004. 2008 No date: tonsil No date: WISDOM TOOTH EXTRACTION; Bilateral     Reproductive/Obstetrics negative OB ROS                             Anesthesia Physical Anesthesia Plan  ASA: 2  Anesthesia Plan: General   Post-op Pain Management:    Induction: Intravenous  PONV Risk Score and Plan: Propofol infusion and TIVA  Airway Management Planned: Natural Airway  Additional Equipment:   Intra-op Plan:   Post-operative Plan:   Informed Consent: I have reviewed the patients History and Physical, chart, labs and discussed the procedure including the risks, benefits and alternatives for the proposed  anesthesia with the patient or authorized representative who has indicated his/her understanding and acceptance.     Dental Advisory Given  Plan Discussed with: CRNA and Surgeon  Anesthesia Plan Comments:         Anesthesia Quick Evaluation

## 2022-06-27 NOTE — H&P (Signed)
Jonathon Bellows, MD [address] [place], [place], [place], [address] [id number], [place], [place], [place], [address] Phone: [phone]  Fax: [phone]  Primary Care Physician:  Burnard Hawthorne, FNP   Pre-Procedure History & Physical: HPI:  Heather Fisher is a 46 y.o. female is here for an colonoscopy.   Past Medical History:  Diagnosis Date   Anxiety    Asthma    Chicken pox    Clotting disorder ([place])    Factor 5 Liden   Hay fever    Heart murmur    functional   Scoliosis     Past Surgical History:  Procedure Laterality Date   CESAREAN SECTION     2004. 2008   tonsil     WISDOM TOOTH EXTRACTION Bilateral     Prior to Admission medications   Medication Sig Start Date End Date Taking? Authorizing Provider  cetirizine (ZYRTEC) 10 MG tablet Take 10 mg by mouth daily.   Yes [provider]  FLUoxetine (PROZAC) 20 MG capsule Take 1 capsule (20 mg total) by mouth every morning. 05/26/22  Yes Arnett, Yvetta Coder, FNP  Cholecalciferol (VITAMIN D3) 10 MCG (400 UNIT) tablet Take 400 Units by mouth daily.    [provider]  sertraline (ZOLOFT) 25 MG tablet TAKE 1 TABLET (25 MG TOTAL) BY MOUTH DAILY. 06/19/22   Kennyth Arnold, FNP  Specialty Vitamins Products (MAGNESIUM, AMINO ACID CHELATE,) 133 MG tablet Take 1 tablet by mouth daily.    [provider]  triamcinolone cream (KENALOG) 0.1 % Apply 1 application topically 2 (two) times daily as needed. For rash at chest. Avoid applying to face, groin, and axilla. 10/01/21   Moye, [place], MD  valACYclovir (VALTREX) 1000 MG tablet Take 2 tablets at onset then take 2 tablets 12 hours later 01/13/22   Laurence Ferrari, [place], MD    Allergies as of 05/29/2022 - Review Complete 05/26/2022  Allergen Reaction Noted   Penicillins     Sulfonamide derivatives      Family History  Problem Relation Age of Onset   Arthritis Mother    Skin cancer Mother        BCC, SCC   Heart murmur Father    Arthritis  Maternal Grandmother    Heart disease Maternal Grandmother    Hypertension Maternal Grandmother    Cancer Maternal Grandmother 80       breast   Breast cancer Maternal Grandmother 80   Heart disease Paternal Grandmother    Diabetes Paternal Grandmother    Colon cancer Neg Hx     Social History   Socioeconomic History   Marital status: Married    Spouse name: Not on file   Number of children: Not on file   Years of education: Not on file   Highest education level: Not on file  Occupational History   Not on file  Tobacco Use   Smoking status: Former    Packs/day: 0.25    Types: Cigarettes    Quit date: 04/20/2016    Years since quitting: 6.1   Smokeless tobacco: Never   Tobacco comments:    Smokes a couple per day.  Vaping Use   Vaping Use: Never used  Substance and Sexual Activity   Alcohol use: No    Alcohol/week: 0.0 standard drinks of alcohol    Comment: Rarely    Drug use: No   Sexual activity: Yes    Partners: Male    Comment: Husband  Other Topics Concern   Not on file  Social History Narrative   Works at [employer], Pharmacist, hospital   Lives with husband and 2 sons (31, 67)    Pets: 1 dog inside    Caffeine- 5 cups coffee, green tea    Right handed    Bachelor's degree   Enjoys- relaxing    Social Determinants of Radio broadcast assistant Strain: Not on file  Food Insecurity: Not on file  Transportation Needs: Not on file  Physical Activity: Not on file  Stress: Not on file  Social Connections: Not on file  Intimate Partner Violence: Not on file    Review of Systems: See HPI, otherwise negative ROS  Physical Exam: BP (!) 122/96   Pulse (!) 59   Temp (!) 96.9 F (36.1 C) (Temporal)   Resp 18   Ht '5\' 4"'$  (1.626 m)   Wt 80.3 kg   SpO2 100%   BMI 30.38 kg/m  General:   Alert,  pleasant and cooperative in NAD Head:  Normocephalic and atraumatic. Neck:  Supple; no masses or thyromegaly. Lungs:  Clear throughout to auscultation, normal  respiratory effort.    Heart:  +S1, +S2, Regular rate and rhythm, No edema. Abdomen:  Soft, nontender and nondistended. Normal bowel sounds, without guarding, and without rebound.   Neurologic:  Alert and  oriented x4;  grossly normal neurologically.  Impression/Plan: Heather Fisher is here for an colonoscopy to be performed for Screening colonoscopy family history of colon polyps   Risks, benefits, limitations, and alternatives regarding  colonoscopy have been reviewed with the patient.  Questions have been answered.  All parties agreeable.   Jonathon Bellows, MD  06/27/2022, 8:49 AM

## 2022-06-27 NOTE — Transfer of Care (Signed)
Immediate Anesthesia Transfer of Care Note  Patient: Heather Fisher  Procedure(s) Performed: COLONOSCOPY WITH PROPOFOL  Patient Location: Endoscopy Unit  Anesthesia Type:General  Level of Consciousness: drowsy  Airway & Oxygen Therapy: Patient Spontanous Breathing  Post-op Assessment: Report given to RN and Post -op Vital signs reviewed and stable  Post vital signs: Reviewed  Last Vitals:  Vitals Value Taken Time  BP    Temp    Pulse 55 06/27/22 0936  Resp 17 06/27/22 0936  SpO2 100 % 06/27/22 0936  Vitals shown include unvalidated device data.  Last Pain:  Vitals:   06/27/22 0827  TempSrc: Temporal  PainSc: 0-No pain         Complications: No notable events documented.

## 2022-06-27 NOTE — Anesthesia Postprocedure Evaluation (Signed)
Anesthesia Post Note  Patient: Heather Fisher  Procedure(s) Performed: COLONOSCOPY WITH PROPOFOL  Patient location during evaluation: PACU Anesthesia Type: General Level of consciousness: awake and awake and alert Pain management: satisfactory to patient Vital Signs Assessment: post-procedure vital signs reviewed and stable Respiratory status: spontaneous breathing and nonlabored ventilation Cardiovascular status: stable Anesthetic complications: no   No notable events documented.   Last Vitals:  Vitals:   06/27/22 0945 06/27/22 0959  BP: 105/79 119/74  Pulse: (!) 58 (!) 55  Resp: 16 14  Temp:    SpO2: 100% 100%    Last Pain:  Vitals:   06/27/22 0930  TempSrc: Temporal  PainSc:                  VAN STAVEREN,Elysha Daw

## 2022-06-27 NOTE — Op Note (Signed)
[facility] Gastroenterology Patient Name: Heather Fisher Procedure Date: 06/27/2022 9:07 AM MRN: [id number] Account #: [id number] Date of Birth: April 23, 1976 Admit Type: Outpatient Age: 46 Room: [facility] ENDO ROOM 4 Gender: Female Note Status: Finalized Instrument Name: Jasper Riling [id number] Procedure:             Colonoscopy Indications:           Screening for colorectal malignant neoplasm Providers:             Jonathon Bellows MD, MD Referring MD:          Jonathon Bellows MD, MD (Referring MD), Yvetta Coder. Arnett                         (Referring MD) Medicines:             Monitored Anesthesia Care Complications:         No immediate complications. Procedure:             Pre-Anesthesia Assessment:                        - Prior to the procedure, a History and Physical was                         performed, and patient medications, allergies and                         sensitivities were reviewed. The patient's tolerance                         of previous anesthesia was reviewed.                        - The risks and benefits of the procedure and the                         sedation options and risks were discussed with the                         patient. All questions were answered and informed                         consent was obtained.                        - ASA Grade Assessment: II - A patient with mild                         systemic disease.                        After obtaining informed consent, the colonoscope was                         passed under direct vision. Throughout the procedure,                         the patient's blood pressure, pulse, and oxygen                         saturations were  monitored continuously. The                         Colonoscope was introduced through the anus and                         advanced to the the cecum, identified by the                         appendiceal orifice. The colonoscopy was performed                          with ease. The patient tolerated the procedure well.                         The quality of the bowel preparation was good. Findings:      The perianal and digital rectal examinations were normal.      The entire examined colon appeared normal on direct and retroflexion       views. Impression:            - The entire examined colon is normal on direct and                         retroflexion views.                        - No specimens collected. Recommendation:        - Discharge patient to home (with escort).                        - Resume previous diet.                        - Continue present medications.                        - Repeat colonoscopy in 10 years for screening                         purposes. Procedure Code(s):     --- Professional ---                        [phone], Colonoscopy, flexible; diagnostic, including                         collection of specimen(s) by brushing or washing, when                         performed (separate procedure) Diagnosis Code(s):     --- Professional ---                        Z12.11, Encounter for screening for malignant neoplasm                         of colon CPT copyright 2019 American Medical Association. All rights reserved. The codes documented in this report are preliminary and upon coder review may  be revised to meet current compliance requirements. Jonathon Bellows, MD Jonathon Bellows MD, MD 06/27/2022 9:33:31 AM This report  has been signed electronically. Number of Addenda: 0 Note Initiated On: 06/27/2022 9:07 AM Scope Withdrawal Time: 0 hours 13 minutes 13 seconds  Total Procedure Duration: 0 hours 17 minutes 55 seconds  Estimated Blood Loss:  Estimated blood loss: none.      [facility]

## 2022-07-01 ENCOUNTER — Encounter: Payer: Self-pay | Admitting: Gastroenterology

## 2022-07-31 ENCOUNTER — Ambulatory Visit
Admission: RE | Admit: 2022-07-31 | Discharge: 2022-07-31 | Disposition: A | Discharge status: Home / Self Care | Payer: BC Managed Care – PPO | Source: Ambulatory Visit | Attending: Family | Admitting: Family

## 2022-07-31 DIAGNOSIS — Z1231 Encounter for screening mammogram for malignant neoplasm of breast: Secondary | ICD-10-CM | POA: Diagnosis not present

## 2022-07-31 DIAGNOSIS — Z0000 Encounter for general adult medical examination without abnormal findings: Secondary | ICD-10-CM | POA: Insufficient documentation

## 2022-10-08 ENCOUNTER — Encounter: Payer: BC Managed Care – PPO | Admitting: Dermatology

## 2022-12-06 ENCOUNTER — Other Ambulatory Visit: Payer: Self-pay | Admitting: Dermatology

## 2022-12-06 DIAGNOSIS — B009 Herpesviral infection, unspecified: Secondary | ICD-10-CM

## 2023-01-14 ENCOUNTER — Ambulatory Visit (INDEPENDENT_AMBULATORY_CARE_PROVIDER_SITE_OTHER): Payer: BC Managed Care – PPO | Admitting: Dermatology

## 2023-01-14 VITALS — BP 122/71 | HR 60

## 2023-01-14 DIAGNOSIS — L814 Other melanin hyperpigmentation: Secondary | ICD-10-CM

## 2023-01-14 DIAGNOSIS — Z1283 Encounter for screening for malignant neoplasm of skin: Secondary | ICD-10-CM | POA: Diagnosis not present

## 2023-01-14 DIAGNOSIS — Z808 Family history of malignant neoplasm of other organs or systems: Secondary | ICD-10-CM

## 2023-01-14 DIAGNOSIS — D229 Melanocytic nevi, unspecified: Secondary | ICD-10-CM | POA: Diagnosis not present

## 2023-01-14 DIAGNOSIS — B009 Herpesviral infection, unspecified: Secondary | ICD-10-CM | POA: Diagnosis not present

## 2023-01-14 DIAGNOSIS — L720 Epidermal cyst: Secondary | ICD-10-CM

## 2023-01-14 DIAGNOSIS — L578 Other skin changes due to chronic exposure to nonionizing radiation: Secondary | ICD-10-CM

## 2023-01-14 MED ORDER — VALACYCLOVIR HCL 1 G PO TABS: ORAL_TABLET | ORAL | 11 refills | Status: DC

## 2023-01-14 NOTE — Progress Notes (Signed)
   Follow-Up Visit   Subjective  Heather Fisher is a 47 y.o. female who presents for the following: Skin Cancer Screening and Full Body Skin Exam. Patient had a cyst that ruptured in January and she would like it checked today.  The patient presents for Total-Body Skin Exam (TBSE) for skin cancer screening and mole check. The patient has spots, moles and lesions to be evaluated, some may be new or changing and the patient has concerns that these could be cancer.  The following portions of the chart were reviewed this encounter and updated as appropriate: medications, allergies, medical history  Review of Systems:  No other skin or systemic complaints except as noted in HPI or Assessment and Plan.  Objective  Well appearing patient in no apparent distress; mood and affect are within normal limits.  A full examination was performed including scalp, head, eyes, ears, nose, lips, neck, chest, axillae, abdomen, back, buttocks, bilateral upper extremities, bilateral lower extremities, hands, feet, fingers, toes, fingernails, and toenails. All findings within normal limits unless otherwise noted below.       Assessment & Plan    LENTIGINES, SEBORRHEIC KERATOSES, HEMANGIOMAS - Benign normal skin lesions - Benign-appearing - Call for any changes  MELANOCYTIC NEVI - Tan-brown and/or pink-flesh-colored symmetric macules and papules - Benign appearing on exam today - Observation - Call clinic for new or changing moles - Recommend daily use of broad spectrum spf 30+ sunscreen to sun-exposed areas.   ACTINIC DAMAGE - Chronic condition, secondary to cumulative UV/sun exposure - diffuse scaly erythematous macules with underlying dyspigmentation - Recommend daily broad spectrum sunscreen SPF 30+ to sun-exposed areas, reapply every 2 hours as needed.  - Staying in the shade or wearing long sleeves, sun glasses (UVA+UVB protection) and wide brim hats (4-inch brim around the entire circumference  of the hat) are also recommended for sun protection.  - Call for new or changing lesions.  FAMILY HISTORY OF SKIN CANCER  EPIDERMAL INCLUSION CYST Exam: Subcutaneous thin papule, drained and flat today at left upper back  Benign-appearing. Exam most consistent with an epidermal inclusion cyst. Discussed that a cyst is a benign growth that can grow over time and sometimes get irritated or inflamed. Recommend observation if it is not bothersome. Discussed option of surgical excision to remove it if it is growing, symptomatic, or other changes noted. Please call for new or changing lesions so they can be evaluated.  HERPESVIRAL INFECTION (COLD SORES) Exam Clear today  Chronic condition with duration or expected duration over one year. Currently well-controlled.  Treatment Plan Take Valacyclovir 2 grams every 12 hours for 2 doses with a glass of water at the first sign of symptoms.  Herpes Simplex Virus = Cold Sores = Fever Blisters is a chronic recurring blistering; scabbing sore-producing viral infection that is recurrent usually in the same area triggered by stress, sun/UV exposure and trauma.  It is infectious and can be spread from person to person by direct contact.  It is not curable, but is treatable with topical and oral medication.   SKIN CANCER SCREENING PERFORMED TODAY  Return in about 1 year (around 01/14/2024) for TBSE.  Luther Redo, CMA, am acting as scribe for Forest Gleason, MD .   Documentation: I have reviewed the above documentation for accuracy and completeness, and I agree with the above.  Forest Gleason, MD

## 2023-01-14 NOTE — Patient Instructions (Signed)
Due to recent changes in healthcare laws, you may see results of your pathology and/or laboratory studies on MyChart before the doctors have had a chance to review them. We understand that in some cases there may be results that are confusing or concerning to you. Please understand that not all results are received at the same time and often the doctors may need to interpret multiple results in order to provide you with the best plan of care or course of treatment. Therefore, we ask that you please give us 2 business days to thoroughly review all your results before contacting the office for clarification. Should we see a critical lab result, you will be contacted sooner.   If You Need Anything After Your Visit  If you have any questions or concerns for your doctor, please call our main line at [phone] and press option 4 to reach your doctor's medical assistant. If no one answers, please leave a voicemail as directed and we will return your call as soon as possible. Messages left after 4 pm will be answered the following business day.   You may also send us a message via MyChart. We typically respond to MyChart messages within 1-2 business days.  For prescription refills, please ask your pharmacy to contact our office. Our fax number is [phone].  If you have an urgent issue when the clinic is closed that cannot wait until the next business day, you can page your doctor at the number below.    Please note that while we do our best to be available for urgent issues outside of office hours, we are not available 24/7.   If you have an urgent issue and are unable to reach us, you may choose to seek medical care at your doctor's office, retail clinic, urgent care center, or emergency room.  If you have a medical emergency, please immediately call 911 or go to the emergency department.  Pager Numbers  - Dr. Kowalski: [phone]  - Dr. Moye: [phone]  - Dr. Stewart:  [phone]  In the event of inclement weather, please call our main line at [phone] for an update on the status of any delays or closures.  Dermatology Medication Tips: Please keep the boxes that topical medications come in in order to help keep track of the instructions about where and how to use these. Pharmacies typically print the medication instructions only on the boxes and not directly on the medication tubes.   If your medication is too expensive, please contact our office at [phone] option 4 or send us a message through MyChart.   We are unable to tell what your co-pay for medications will be in advance as this is different depending on your insurance coverage. However, we may be able to find a substitute medication at lower cost or fill out paperwork to get insurance to cover a needed medication.   If a prior authorization is required to get your medication covered by your insurance company, please allow us 1-2 business days to complete this process.  Drug prices often vary depending on where the prescription is filled and some pharmacies may offer cheaper prices.  The website www.goodrx.com contains coupons for medications through different pharmacies. The prices here do not account for what the cost may be with help from insurance (it may be cheaper with your insurance), but the website can give you the price if you did not use any insurance.  - You can print the associated coupon and take it with   your prescription to the pharmacy.  - You may also stop by our office during regular business hours and pick up a GoodRx coupon card.  - If you need your prescription sent electronically to a different pharmacy, notify our office through [facility] MyChart or by phone at [phone] option 4.     Si Usted Necesita Algo Despus de Su Visita  Tambin puede enviarnos un mensaje a travs de MyChart. Por lo general respondemos a los mensajes de MyChart en el transcurso de 1 a 2  das hbiles.  Para renovar recetas, por favor pida a su farmacia que se ponga en contacto con nuestra oficina. Nuestro nmero de fax es el [phone].  Si tiene un asunto urgente cuando la clnica est cerrada y que no puede esperar hasta el siguiente da hbil, puede llamar/localizar a su doctor(a) al nmero que aparece a continuacin.   Por favor, tenga en cuenta que aunque hacemos todo lo posible para estar disponibles para asuntos urgentes fuera del horario de oficina, no estamos disponibles las 24 horas del da, los 7 das de la semana.   Si tiene un problema urgente y no puede comunicarse con nosotros, puede optar por buscar atencin mdica  en el consultorio de su doctor(a), en una clnica privada, en un centro de atencin urgente o en una sala de emergencias.  Si tiene una emergencia mdica, por favor llame inmediatamente al 911 o vaya a la sala de emergencias.  Nmeros de bper  - Dr. Kowalski: [phone]  - Dra. Moye: [phone]  - Dra. Stewart: [phone]  En caso de inclemencias del tiempo, por favor llame a nuestra lnea principal al [phone] para una actualizacin sobre el estado de cualquier retraso o cierre.  Consejos para la medicacin en dermatologa: Por favor, guarde las cajas en las que vienen los medicamentos de uso tpico para ayudarle a seguir las instrucciones sobre dnde y cmo usarlos. Las farmacias generalmente imprimen las instrucciones del medicamento slo en las cajas y no directamente en los tubos del medicamento.   Si su medicamento es muy caro, por favor, pngase en contacto con nuestra oficina llamando al [phone] y presione la opcin 4 o envenos un mensaje a travs de MyChart.   No podemos decirle cul ser su copago por los medicamentos por adelantado ya que esto es diferente dependiendo de la cobertura de su seguro. Sin embargo, es posible que podamos encontrar un medicamento sustituto a menor costo o llenar un formulario para que el  seguro cubra el medicamento que se considera necesario.   Si se requiere una autorizacin previa para que su compaa de seguros cubra su medicamento, por favor permtanos de 1 a 2 das hbiles para completar este proceso.  Los precios de los medicamentos varan con frecuencia dependiendo del lugar de dnde se surte la receta y alguna farmacias pueden ofrecer precios ms baratos.  El sitio web www.goodrx.com tiene cupones para medicamentos de diferentes farmacias. Los precios aqu no tienen en cuenta lo que podra costar con la ayuda del seguro (puede ser ms barato con su seguro), pero el sitio web puede darle el precio si no utiliz ningn seguro.  - Puede imprimir el cupn correspondiente y llevarlo con su receta a la farmacia.  - Tambin puede pasar por nuestra oficina durante el horario de atencin regular y recoger una tarjeta de cupones de GoodRx.  - Si necesita que su receta se enve electrnicamente a una farmacia diferente, informe a nuestra oficina a travs de MyChart de [place]   o por telfono llamando al [phone] y presione la opcin 4.  

## 2023-01-19 ENCOUNTER — Encounter: Payer: Self-pay | Admitting: Dermatology

## 2023-03-21 ENCOUNTER — Other Ambulatory Visit: Payer: Self-pay | Admitting: Dermatology

## 2023-03-21 DIAGNOSIS — B009 Herpesviral infection, unspecified: Secondary | ICD-10-CM

## 2023-05-18 ENCOUNTER — Other Ambulatory Visit: Payer: Self-pay | Admitting: Family

## 2023-05-18 DIAGNOSIS — F32A Depression, unspecified: Secondary | ICD-10-CM

## 2023-05-29 ENCOUNTER — Encounter: Payer: BC Managed Care – PPO | Admitting: Family

## 2023-06-02 ENCOUNTER — Ambulatory Visit: Payer: BC Managed Care – PPO | Admitting: Family

## 2023-06-02 ENCOUNTER — Encounter: Payer: Self-pay | Admitting: Family

## 2023-06-02 VITALS — BP 118/78 | HR 60 | Temp 98.1°F | Ht 64.5 in | Wt 181.4 lb

## 2023-06-02 DIAGNOSIS — E538 Deficiency of other specified B group vitamins: Secondary | ICD-10-CM | POA: Diagnosis not present

## 2023-06-02 DIAGNOSIS — R635 Abnormal weight gain: Secondary | ICD-10-CM | POA: Diagnosis not present

## 2023-06-02 DIAGNOSIS — Z0000 Encounter for general adult medical examination without abnormal findings: Secondary | ICD-10-CM | POA: Diagnosis not present

## 2023-06-02 DIAGNOSIS — Z8639 Personal history of other endocrine, nutritional and metabolic disease: Secondary | ICD-10-CM | POA: Diagnosis not present

## 2023-06-02 DIAGNOSIS — F419 Anxiety disorder, unspecified: Secondary | ICD-10-CM

## 2023-06-02 DIAGNOSIS — F32A Depression, unspecified: Secondary | ICD-10-CM

## 2023-06-02 DIAGNOSIS — R5383 Other fatigue: Secondary | ICD-10-CM | POA: Insufficient documentation

## 2023-06-02 LAB — CBC WITH DIFFERENTIAL/PLATELET
Basophils Absolute: 0 10*3/uL (ref 0.0–0.1)
Basophils Relative: 0.7 % (ref 0.0–3.0)
Eosinophils Absolute: 0.3 10*3/uL (ref 0.0–0.7)
Eosinophils Relative: 5.1 % — ABNORMAL HIGH (ref 0.0–5.0)
HCT: 39.4 % (ref 36.0–46.0)
Hemoglobin: 13 g/dL (ref 12.0–15.0)
Lymphocytes Relative: 34.2 % (ref 12.0–46.0)
Lymphs Abs: 1.8 10*3/uL (ref 0.7–4.0)
MCHC: 33 g/dL (ref 30.0–36.0)
MCV: 90.4 fl (ref 78.0–100.0)
Monocytes Absolute: 0.4 10*3/uL (ref 0.1–1.0)
Monocytes Relative: 6.9 % (ref 3.0–12.0)
Neutro Abs: 2.8 10*3/uL (ref 1.4–7.7)
Neutrophils Relative %: 53.1 % (ref 43.0–77.0)
Platelets: 296 10*3/uL (ref 150.0–400.0)
RBC: 4.36 Mil/uL (ref 3.87–5.11)
RDW: 13.9 % (ref 11.5–15.5)
WBC: 5.3 10*3/uL (ref 4.0–10.5)

## 2023-06-02 LAB — LIPID PANEL
Cholesterol: 232 mg/dL — ABNORMAL HIGH (ref 0–200)
HDL: 67.6 mg/dL (ref >39.00)
LDL Cholesterol: 151 mg/dL — ABNORMAL HIGH (ref 0–99)
NonHDL: 164.7
Total CHOL/HDL Ratio: 3
Triglycerides: 69 mg/dL (ref 0.0–149.0)
VLDL: 13.8 mg/dL (ref 0.0–40.0)

## 2023-06-02 LAB — VITAMIN D 25 HYDROXY (VIT D DEFICIENCY, FRACTURES): VITD: 45.39 ng/mL (ref 30.00–100.00)

## 2023-06-02 LAB — COMPREHENSIVE METABOLIC PANEL
ALT: 14 U/L (ref 0–35)
AST: 17 U/L (ref 0–37)
Albumin: 4.5 g/dL (ref 3.5–5.2)
Alkaline Phosphatase: 46 U/L (ref 39–117)
BUN: 12 mg/dL (ref 6–23)
CO2: 27 mEq/L (ref 19–32)
Calcium: 10 mg/dL (ref 8.4–10.5)
Chloride: 101 mEq/L (ref 96–112)
Creatinine, Ser: 0.85 mg/dL (ref 0.40–1.20)
GFR: 81.49 mL/min (ref >60.00)
Glucose, Bld: 83 mg/dL (ref 70–99)
Potassium: 4.1 mEq/L (ref 3.5–5.1)
Sodium: 136 mEq/L (ref 135–145)
Total Bilirubin: 0.5 mg/dL (ref 0.2–1.2)
Total Protein: 7.5 g/dL (ref 6.0–8.3)

## 2023-06-02 LAB — B12 AND FOLATE PANEL
Folate: >23.8 ng/mL (ref >5.9)
Vitamin B-12: 797 pg/mL (ref 211–911)

## 2023-06-02 LAB — HEMOGLOBIN A1C: Hgb A1c MFr Bld: 5.3 % (ref 4.6–6.5)

## 2023-06-02 LAB — TSH: TSH: 3.38 u[IU]/mL (ref 0.35–5.50)

## 2023-06-02 NOTE — Assessment & Plan Note (Signed)
Etiology unclear at this time.  We discussed thorough lab investigation.  Fatigue is improved with exercise.  Patient symptoms do not support OSA and we jointly agreed to defer testing for OSA.  Patient  declines further evaluation for fatigue at this time.  She politely declines chest x-ray to evaluate lymphadenopathy as she does not feel that is necessary

## 2023-06-02 NOTE — Progress Notes (Signed)
Assessment & Plan:  B12 deficiency -     B12 and Folate Panel  History of vitamin D deficiency -     VITAMIN D 25 Hydroxy (Vit-D Deficiency, Fractures)  Routine physical examination Assessment & Plan: Clinical breast exam performed today.  Deferred pelvic exam and Pap smear due to patient preference.  She will come back for this.  Patient will schedule mammogram.  Congratulated patient on diligence to exercise  Orders: -     Hemoglobin A1c -     CBC with Differential/Platelet -     Comprehensive metabolic panel -     Lipid panel -     TSH -     3D Screening Mammogram, Left and Right; Future -     B12 and Folate Panel  Weight gain Assessment & Plan: Discussed weight gain and briefly discussed medications including metformin, Wellbutrin.  Patient does have remote history of febrile seizures as an infant approximately 1 or 2 episodes.  With such remote history and after consulting with pharmacist, Nilda Simmer, we jointly agreed reasonable to try Wellbutrin if that were to be a medication of choice.  Will await A1c and plan to discuss further at follow-up.  We discussed trialing weight watchers   Anxiety and depression Assessment & Plan: Chronic, stable.  Continue Prozac 20 mg   Other fatigue Assessment & Plan: Etiology unclear at this time.  We discussed thorough lab investigation.  Fatigue is improved with exercise.  Patient symptoms do not support OSA and we jointly agreed to defer testing for OSA.  Patient  declines further evaluation for fatigue at this time.  She politely declines chest x-ray to evaluate lymphadenopathy as she does not feel that is necessary      Return precautions given.   Risks, benefits, and alternatives of the medications and treatment plan prescribed today were discussed, and patient expressed understanding.   Education regarding symptom management and diagnosis given to patient on AVS either electronically or printed.  No follow-ups on  file.  Rennie Plowman, FNP  Subjective:    Patient ID: Heather Fisher, female    DOB: 11/16/1975, 47 y.o.   MRN: [id number]  CC: Heather Fisher is a 47 y.o. female who presents today for physical exam.    HPI: Frustrated by weight gain.     She continues to follow with dermatology  Fatigue is episodic.   Fatigue does improve in the summers when she is not working more .  More noticeable  in the afternoon. Sleep is restorative. Energy improves with exercise.  denies snoring.  She feels depression and anxiety are adequately controlled on Prozac  H/o GDM No history of anorexia or bulimia.  No heavy alcohol use.  As a child she had 1-2 episodes of febrile seizure.  Colorectal Cancer Screening: UTD , Dr. Tobi Bastos 06/27/2022; repeat in 10 years Breast Cancer Screening: Mammogram UTD Cervical Cancer Screening: due; last on 05/17/2020 negative malignancy, HPV Bone Health screening/DEXA for 65+: No increased fracture risk. Defer screening at this time.       Tetanus - UTD Exercise: Gets regular exercise. MWF with bootcamp.   Alcohol use:  occassional Smoking/tobacco use: Former smoker  Health Maintenance  Topic Date Due   COVID-19 Vaccine (3 - 2023-24 season) 06/27/2022   Pap Smear  05/18/2023   Flu Shot  05/28/2023   DTaP/Tdap/Td vaccine (3 - Td or Tdap) 04/24/2027   Colon Cancer Screening  06/27/2032   Hepatitis C Screening  Completed  HIV Screening  Completed   HPV Vaccine  Aged Out    ALLERGIES: Penicillins and Sulfonamide derivatives  Current Outpatient Medications on File Prior to Visit  Medication Sig Dispense Refill   cetirizine (ZYRTEC) 10 MG tablet Take 10 mg by mouth daily.     Cholecalciferol (VITAMIN D3) 10 MCG (400 UNIT) tablet Take 400 Units by mouth daily.     cyanocobalamin (VITAMIN B12) 1000 MCG tablet Take 1,000 mcg by mouth daily.     FLUoxetine (PROZAC) 20 MG capsule TAKE 1 CAPSULE BY MOUTH EVERY DAY IN THE MORNING 90 capsule 3   Specialty Vitamins Products  (MAGNESIUM, AMINO ACID CHELATE,) 133 MG tablet Take 1 tablet by mouth daily.     valACYclovir (VALTREX) 1000 MG tablet TAKE 2 TABLETS AT ONSET THEN TAKE 2 TABLETS 12 HOURS LATER 20 tablet 11   No current facility-administered medications on file prior to visit.    Review of Systems  Constitutional:  Positive for fatigue. Negative for chills, fever and unexpected weight change.  HENT:  Negative for congestion.   Respiratory:  Negative for cough.   Cardiovascular:  Negative for chest pain, palpitations and leg swelling.  Gastrointestinal:  Negative for nausea and vomiting.  Musculoskeletal:  Negative for arthralgias and myalgias.  Skin:  Negative for rash.  Neurological:  Negative for headaches.  Hematological:  Negative for adenopathy.  Psychiatric/Behavioral:  Negative for confusion.       Objective:    BP 118/78   Pulse 60   Temp 98.1 F (36.7 C) (Oral)   Ht 5' 4.5" (1.638 m)   Wt 181 lb 6.4 oz (82.3 kg)   LMP  (LMP Unknown)   SpO2 99%   BMI 30.66 kg/m   BP Readings from Last 3 Encounters:  06/02/23 118/78  01/14/23 122/71  06/27/22 119/74   Wt Readings from Last 3 Encounters:  06/02/23 181 lb 6.4 oz (82.3 kg)  06/27/22 177 lb (80.3 kg)  05/26/22 179 lb 6.4 oz (81.4 kg)      06/02/2023   10:10 AM 05/26/2022   11:00 AM 05/20/2021    8:38 AM  Depression screen PHQ 2/9  Decreased Interest 1 2 0  Down, Depressed, Hopeless 1 2 0  PHQ - 2 Score 2 4 0  Altered sleeping 2 3   Tired, decreased energy 1 3   Change in appetite 1 3   Feeling bad or failure about yourself  1 3   Trouble concentrating 0 1   Moving slowly or fidgety/restless 0 1   Suicidal thoughts 0 0   PHQ-9 Score 7 18   Difficult doing work/chores Somewhat difficult Somewhat difficult      Physical Exam Vitals reviewed.  Constitutional:      Appearance: She is well-developed.  Eyes:     Conjunctiva/sclera: Conjunctivae normal.  Neck:     Thyroid: No thyroid mass or thyromegaly.  Cardiovascular:      Rate and Rhythm: Normal rate and regular rhythm.     Pulses: Normal pulses.     Heart sounds: Normal heart sounds.  Pulmonary:     Effort: Pulmonary effort is normal.     Breath sounds: Normal breath sounds. No wheezing, rhonchi or rales.  Chest:  Breasts:    Breasts are symmetrical.     Right: No inverted nipple, mass, nipple discharge, skin change or tenderness.     Left: No inverted nipple, mass, nipple discharge, skin change or tenderness.  Lymphadenopathy:     Head:  Right side of head: No submental, submandibular, tonsillar, preauricular, posterior auricular or occipital adenopathy.     Left side of head: No submental, submandibular, tonsillar, preauricular, posterior auricular or occipital adenopathy.     Cervical: No cervical adenopathy.     Right cervical: No superficial, deep or posterior cervical adenopathy.    Left cervical: No superficial, deep or posterior cervical adenopathy.  Skin:    General: Skin is warm and dry.  Neurological:     Mental Status: She is alert.  Psychiatric:        Speech: Speech normal.        Behavior: Behavior normal.        Thought Content: Thought content normal.

## 2023-06-02 NOTE — Patient Instructions (Addendum)
We discussed wellbutrin and metformin.  We can continue to discuss options for weight loss after your labs.  Health Maintenance, Female Adopting a healthy lifestyle and getting preventive care are important in promoting health and wellness. Ask your health care provider about: The right schedule for you to have regular tests and exams. Things you can do on your own to prevent diseases and keep yourself healthy. What should I know about diet, weight, and exercise? Eat a healthy diet  Eat a diet that includes plenty of vegetables, fruits, low-fat dairy products, and lean protein. Do not eat a lot of foods that are high in solid fats, added sugars, or sodium. Maintain a healthy weight Body mass index (BMI) is used to identify weight problems. It estimates body fat based on height and weight. Your health care provider can help determine your BMI and help you achieve or maintain a healthy weight. Get regular exercise Get regular exercise. This is one of the most important things you can do for your health. Most adults should: Exercise for at least 150 minutes each week. The exercise should increase your heart rate and make you sweat (moderate-intensity exercise). Do strengthening exercises at least twice a week. This is in addition to the moderate-intensity exercise. Spend less time sitting. Even light physical activity can be beneficial. Watch cholesterol and blood lipids Have your blood tested for lipids and cholesterol at 47 years of age, then have this test every 5 years. Have your cholesterol levels checked more often if: Your lipid or cholesterol levels are high. You are older than 47 years of age. You are at high risk for heart disease. What should I know about cancer screening? Depending on your health history and family history, you may need to have cancer screening at various ages. This may include screening for: Breast cancer. Cervical cancer. Colorectal cancer. Skin  cancer. Lung cancer. What should I know about heart disease, diabetes, and high blood pressure? Blood pressure and heart disease High blood pressure causes heart disease and increases the risk of stroke. This is more likely to develop in people who have high blood pressure readings or are overweight. Have your blood pressure checked: Every 3-5 years if you are 41-6 years of age. Every year if you are 56 years old or older. Diabetes Have regular diabetes screenings. This checks your fasting blood sugar level. Have the screening done: Once every three years after age 75 if you are at a normal weight and have a low risk for diabetes. More often and at a younger age if you are overweight or have a high risk for diabetes. What should I know about preventing infection? Hepatitis B If you have a higher risk for hepatitis B, you should be screened for this virus. Talk with your health care provider to find out if you are at risk for hepatitis B infection. Hepatitis C Testing is recommended for: Everyone born from [birth year] through 1965. Anyone with known risk factors for hepatitis C. Sexually transmitted infections (STIs) Get screened for STIs, including gonorrhea and chlamydia, if: You are sexually active and are younger than 47 years of age. You are older than 47 years of age and your health care provider tells you that you are at risk for this type of infection. Your sexual activity has changed since you were last screened, and you are at increased risk for chlamydia or gonorrhea. Ask your health care provider if you are at risk. Ask your health care provider about whether you  are at high risk for HIV. Your health care provider may recommend a prescription medicine to help prevent HIV infection. If you choose to take medicine to prevent HIV, you should first get tested for HIV. You should then be tested every 3 months for as long as you are taking the medicine. Pregnancy If you are about to stop  having your period (premenopausal) and you may become pregnant, seek counseling before you get pregnant. Take 400 to 800 micrograms (mcg) of folic acid every day if you become pregnant. Ask for birth control (contraception) if you want to prevent pregnancy. Osteoporosis and menopause Osteoporosis is a disease in which the bones lose minerals and strength with aging. This can result in bone fractures. If you are 20 years old or older, or if you are at risk for osteoporosis and fractures, ask your health care provider if you should: Be screened for bone loss. Take a calcium or vitamin D supplement to lower your risk of fractures. Be given hormone replacement therapy (HRT) to treat symptoms of menopause. Follow these instructions at home: Alcohol use Do not drink alcohol if: Your health care provider tells you not to drink. You are pregnant, may be pregnant, or are planning to become pregnant. If you drink alcohol: Limit how much you have to: 0-1 drink a day. Know how much alcohol is in your drink. In the [place] one drink equals one 12 oz bottle of beer (355 mL), one 5 oz glass of wine (148 mL), or one 1 oz glass of hard liquor (44 mL). Lifestyle Do not use any products that contain nicotine or tobacco. These products include cigarettes, chewing tobacco, and vaping devices, such as e-cigarettes. If you need help quitting, ask your health care provider. Do not use street drugs. Do not share needles. Ask your health care provider for help if you need support or information about quitting drugs. General instructions Schedule regular health, dental, and eye exams. Stay current with your vaccines. Tell your health care provider if: You often feel depressed. You have ever been abused or do not feel safe at home. Summary Adopting a healthy lifestyle and getting preventive care are important in promoting health and wellness. Follow your health care provider's instructions about healthy diet,  exercising, and getting tested or screened for diseases. Follow your health care provider's instructions on monitoring your cholesterol and blood pressure. This information is not intended to replace advice given to you by your health care provider. Make sure you discuss any questions you have with your health care provider. Document Revised: 03/04/2021 Document Reviewed: 03/04/2021 Elsevier Patient Education  2024 [employer].

## 2023-06-02 NOTE — Assessment & Plan Note (Signed)
Chronic, stable.  Continue Prozac 20 mg. 

## 2023-06-02 NOTE — Assessment & Plan Note (Addendum)
Discussed weight gain and briefly discussed medications including metformin, Wellbutrin.  Patient does have remote history of febrile seizures as an infant approximately 1 or 2 episodes.  With such remote history and after consulting with pharmacist, Nilda Simmer, we jointly agreed reasonable to try Wellbutrin if that were to be a medication of choice.  Will await A1c and plan to discuss further at follow-up.  We discussed trialing weight watchers

## 2023-06-02 NOTE — Assessment & Plan Note (Signed)
Clinical breast exam performed today.  Deferred pelvic exam and Pap smear due to patient preference.  She will come back for this.  Patient will schedule mammogram.  Congratulated patient on diligence to exercise

## 2023-06-10 NOTE — Addendum Note (Signed)
Addended by: [place], Koben Daman on: 06/10/2023 08:51 AM   Modules accepted: Orders

## 2023-06-11 ENCOUNTER — Encounter (INDEPENDENT_AMBULATORY_CARE_PROVIDER_SITE_OTHER): Payer: Self-pay

## 2023-07-02 ENCOUNTER — Ambulatory Visit: Payer: BC Managed Care – PPO | Admitting: Family

## 2023-07-02 ENCOUNTER — Other Ambulatory Visit (HOSPITAL_COMMUNITY)
Admission: RE | Admit: 2023-07-02 | Discharge: 2023-07-02 | Disposition: A | Discharge status: Home / Self Care | Payer: BC Managed Care – PPO | Source: Ambulatory Visit | Attending: Family | Admitting: Family

## 2023-07-02 ENCOUNTER — Encounter: Payer: Self-pay | Admitting: Family

## 2023-07-02 VITALS — BP 120/76 | HR 98 | Temp 98.6°F | Ht 65.0 in | Wt 177.9 lb

## 2023-07-02 DIAGNOSIS — Z124 Encounter for screening for malignant neoplasm of cervix: Secondary | ICD-10-CM | POA: Insufficient documentation

## 2023-07-02 DIAGNOSIS — R635 Abnormal weight gain: Secondary | ICD-10-CM | POA: Diagnosis not present

## 2023-07-02 DIAGNOSIS — R5383 Other fatigue: Secondary | ICD-10-CM | POA: Diagnosis not present

## 2023-07-02 NOTE — Assessment & Plan Note (Signed)
Overall unchanged.  Patient politely declines further evaluation including chest x-ray sleep apnea evaluation.  Advised her to let me know how she is doing

## 2023-07-02 NOTE — Assessment & Plan Note (Signed)
Pap smear obtained today. 

## 2023-07-02 NOTE — Patient Instructions (Signed)
Please let me know if fatigue persists or new symptoms develop as this would warrant prompt evaluation.   Nice to see you today!

## 2023-07-02 NOTE — Assessment & Plan Note (Signed)
Congratulate patient on weight loss.  Please deferred starting any weight loss medication at this time

## 2023-07-02 NOTE — Progress Notes (Signed)
Assessment & Plan:  Screening for cervical cancer -     Cytology - PAP  Weight gain Assessment & Plan: Congratulate patient on weight loss.  Please deferred starting any weight loss medication at this time   Cervical cancer screening Assessment & Plan: Pap smear obtained today   Other fatigue Assessment & Plan: Overall unchanged.  Patient politely declines further evaluation including chest x-ray sleep apnea evaluation.  Advised her to let me know how she is doing      Return precautions given.   Risks, benefits, and alternatives of the medications and treatment plan prescribed today were discussed, and patient expressed understanding.   Education regarding symptom management and diagnosis given to patient on AVS either electronically or printed.  Return in about 1 year (around 07/01/2024) for Complete Physical Exam.  Rennie Plowman, FNP  Subjective:    Patient ID: Heather Fisher, female    DOB: 07/13/1976, 47 y.o.   MRN: [id number]  CC: Heather Fisher is a 47 y.o. female who presents today for physical exam.    HPI: Feels well today.  No new complaints.  She has lost weight.  She is busier now that she is at school.  She continues to feel fatigued   Colorectal Cancer Screening: UTD , 07/07/2022, repeat in 10 yrs Breast Cancer Screening: Mammogram scheduled Cervical Cancer Screening: due; obtained 05/17/2020, negative malignancy, negative HPV  Bone Health screening/DEXA for 65+: No increased fracture risk. Defer screening at this time.  Lung Cancer Screening: Doesn't have 20 year pack year history and age > 16 years yo 29 years       Tetanus - UTD Exercise: Gets regular exercise.   Alcohol use:  rare Smoking/tobacco use: former smoker.    Health Maintenance  Topic Date Due  . COVID-19 Vaccine (3 - 2023-24 season) 06/28/2023  . Pap Smear  05/18/2023  . Flu Shot  01/25/2024*  . DTaP/Tdap/Td vaccine (3 - Td or Tdap) 04/24/2027  . Colon Cancer Screening   06/27/2032  . Hepatitis C Screening  Completed  . HIV Screening  Completed  . HPV Vaccine  Aged Out  *Topic was postponed. The date shown is not the original due date.    ALLERGIES: Penicillins and Sulfonamide derivatives  Current Outpatient Medications on File Prior to Visit  Medication Sig Dispense Refill  . cetirizine (ZYRTEC) 10 MG tablet Take 10 mg by mouth daily.    . Cholecalciferol (VITAMIN D3) 10 MCG (400 UNIT) tablet Take 400 Units by mouth daily.    . cyanocobalamin (VITAMIN B12) 1000 MCG tablet Take 1,000 mcg by mouth daily.    Marland Kitchen FLUoxetine (PROZAC) 20 MG capsule TAKE 1 CAPSULE BY MOUTH EVERY DAY IN THE MORNING 90 capsule 3  . Specialty Vitamins Products (MAGNESIUM, AMINO ACID CHELATE,) 133 MG tablet Take 1 tablet by mouth daily.    . valACYclovir (VALTREX) 1000 MG tablet TAKE 2 TABLETS AT ONSET THEN TAKE 2 TABLETS 12 HOURS LATER 20 tablet 11   No current facility-administered medications on file prior to visit.    Review of Systems  Constitutional:  Positive for fatigue. Negative for chills and fever.  Respiratory:  Negative for cough.   Cardiovascular:  Negative for chest pain and palpitations.  Gastrointestinal:  Negative for nausea and vomiting.      Objective:    BP 120/76   Pulse 98   Temp 98.6 F (37 C) (Oral)   Ht 5\' 5"  (1.651 m)   Wt 177 lb 14.4  oz (80.7 kg)   LMP  (LMP Unknown)   SpO2 99%   BMI 29.60 kg/m   BP Readings from Last 3 Encounters:  07/02/23 120/76  06/02/23 118/78  01/14/23 122/71   Wt Readings from Last 3 Encounters:  07/02/23 177 lb 14.4 oz (80.7 kg)  06/02/23 181 lb 6.4 oz (82.3 kg)  06/27/22 177 lb (80.3 kg)      07/02/2023    3:56 PM 06/02/2023   10:10 AM 05/26/2022   11:00 AM  Depression screen PHQ 2/9  Decreased Interest 0 1 2  Down, Depressed, Hopeless 1 1 2   PHQ - 2 Score 1 2 4   Altered sleeping 2 2 3   Tired, decreased energy 2 1 3   Change in appetite 2 1 3   Feeling bad or failure about yourself  1 1 3   Trouble  concentrating 0 0 1  Moving slowly or fidgety/restless 1 0 1  Suicidal thoughts 0 0 0  PHQ-9 Score 9 7 18   Difficult doing work/chores Not difficult at all Somewhat difficult Somewhat difficult    Physical Exam Vitals reviewed.  Constitutional:      Appearance: Normal appearance. She is well-developed.  Eyes:     Conjunctiva/sclera: Conjunctivae normal.  Neck:     Thyroid: No thyroid mass or thyromegaly.  Cardiovascular:     Rate and Rhythm: Normal rate and regular rhythm.     Pulses: Normal pulses.     Heart sounds: Normal heart sounds.  Pulmonary:     Effort: Pulmonary effort is normal.     Breath sounds: Normal breath sounds. No wheezing, rhonchi or rales.  Chest:  Breasts:    Breasts are symmetrical.     Right: No inverted nipple, mass, nipple discharge, skin change or tenderness.     Left: No inverted nipple, mass, nipple discharge, skin change or tenderness.  Abdominal:     General: Bowel sounds are normal. There is no distension.     Palpations: Abdomen is soft. Abdomen is not rigid. There is no fluid wave or mass.     Tenderness: There is no abdominal tenderness. There is no guarding or rebound.  Lymphadenopathy:     Head:     Right side of head: No submental, submandibular, tonsillar, preauricular, posterior auricular or occipital adenopathy.     Left side of head: No submental, submandibular, tonsillar, preauricular, posterior auricular or occipital adenopathy.     Cervical: No cervical adenopathy.     Right cervical: No superficial, deep or posterior cervical adenopathy.    Left cervical: No superficial, deep or posterior cervical adenopathy.  Skin:    General: Skin is warm and dry.  Neurological:     Mental Status: She is alert.  Psychiatric:        Speech: Speech normal.        Behavior: Behavior normal.        Thought Content: Thought content normal.

## 2023-07-08 LAB — CYTOLOGY - PAP
Comment: NEGATIVE
Diagnosis: UNDETERMINED — AB
High risk HPV: NEGATIVE

## 2023-07-16 ENCOUNTER — Other Ambulatory Visit (INDEPENDENT_AMBULATORY_CARE_PROVIDER_SITE_OTHER): Payer: BC Managed Care – PPO

## 2023-07-16 DIAGNOSIS — Z0000 Encounter for general adult medical examination without abnormal findings: Secondary | ICD-10-CM

## 2023-07-17 LAB — CBC WITH DIFFERENTIAL/PLATELET
Basophils Absolute: 0 10*3/uL (ref 0.0–0.1)
Basophils Relative: 0.8 % (ref 0.0–3.0)
Eosinophils Absolute: 0.2 10*3/uL (ref 0.0–0.7)
Eosinophils Relative: 3.7 % (ref 0.0–5.0)
HCT: 37.3 % (ref 36.0–46.0)
Hemoglobin: 12.4 g/dL (ref 12.0–15.0)
Lymphocytes Relative: 30.5 % (ref 12.0–46.0)
Lymphs Abs: 1.9 10*3/uL (ref 0.7–4.0)
MCHC: 33.3 g/dL (ref 30.0–36.0)
MCV: 89.9 fl (ref 78.0–100.0)
Monocytes Absolute: 0.4 10*3/uL (ref 0.1–1.0)
Monocytes Relative: 6 % (ref 3.0–12.0)
Neutro Abs: 3.7 10*3/uL (ref 1.4–7.7)
Neutrophils Relative %: 59 % (ref 43.0–77.0)
Platelets: 283 10*3/uL (ref 150.0–400.0)
RBC: 4.15 Mil/uL (ref 3.87–5.11)
RDW: 13 % (ref 11.5–15.5)
WBC: 6.3 10*3/uL (ref 4.0–10.5)

## 2023-08-04 ENCOUNTER — Ambulatory Visit
Admission: RE | Admit: 2023-08-04 | Discharge: 2023-08-04 | Disposition: A | Discharge status: Home / Self Care | Payer: BC Managed Care – PPO | Source: Ambulatory Visit | Attending: Family | Admitting: Family

## 2023-08-04 DIAGNOSIS — Z0000 Encounter for general adult medical examination without abnormal findings: Secondary | ICD-10-CM | POA: Diagnosis not present

## 2023-08-04 DIAGNOSIS — Z1231 Encounter for screening mammogram for malignant neoplasm of breast: Secondary | ICD-10-CM | POA: Insufficient documentation

## 2024-01-19 ENCOUNTER — Ambulatory Visit: Payer: BC Managed Care – PPO | Admitting: Dermatology

## 2024-01-19 ENCOUNTER — Encounter: Payer: Self-pay | Admitting: Dermatology

## 2024-01-19 DIAGNOSIS — Z1283 Encounter for screening for malignant neoplasm of skin: Secondary | ICD-10-CM

## 2024-01-19 DIAGNOSIS — W908XXA Exposure to other nonionizing radiation, initial encounter: Secondary | ICD-10-CM | POA: Diagnosis not present

## 2024-01-19 DIAGNOSIS — D225 Melanocytic nevi of trunk: Secondary | ICD-10-CM

## 2024-01-19 DIAGNOSIS — D2271 Melanocytic nevi of right lower limb, including hip: Secondary | ICD-10-CM

## 2024-01-19 DIAGNOSIS — D229 Melanocytic nevi, unspecified: Secondary | ICD-10-CM

## 2024-01-19 DIAGNOSIS — L821 Other seborrheic keratosis: Secondary | ICD-10-CM

## 2024-01-19 DIAGNOSIS — L578 Other skin changes due to chronic exposure to nonionizing radiation: Secondary | ICD-10-CM

## 2024-01-19 DIAGNOSIS — D1801 Hemangioma of skin and subcutaneous tissue: Secondary | ICD-10-CM

## 2024-01-19 DIAGNOSIS — L814 Other melanin hyperpigmentation: Secondary | ICD-10-CM | POA: Diagnosis not present

## 2024-01-19 DIAGNOSIS — L573 Poikiloderma of Civatte: Secondary | ICD-10-CM

## 2024-01-19 DIAGNOSIS — L719 Rosacea, unspecified: Secondary | ICD-10-CM

## 2024-01-19 DIAGNOSIS — Z7189 Other specified counseling: Secondary | ICD-10-CM

## 2024-01-19 MED ORDER — METRONIDAZOLE 0.75 % EX CREA: TOPICAL_CREAM | Freq: Two times a day (BID) | CUTANEOUS | 0 refills | Status: DC

## 2024-01-19 NOTE — Patient Instructions (Addendum)
 Normand Sloop, M.D. [address], [address] [place], [place] [address] [phone]  [email or web address]    Melanoma ABCDEs  Melanoma is the most dangerous type of skin cancer, and is the leading cause of death from skin disease.  You are more likely to develop melanoma if you: Have light-colored skin, light-colored eyes, or red or blond hair Spend a lot of time in the sun Tan regularly, either outdoors or in a tanning bed Have had blistering sunburns, especially during childhood Have a close family member who has had a melanoma Have atypical moles or large birthmarks  Early detection of melanoma is key since treatment is typically straightforward and cure rates are extremely high if we catch it early.   The first sign of melanoma is often a change in a mole or a new dark spot.  The ABCDE system is a way of remembering the signs of melanoma.  A for asymmetry:  The two halves do not match. B for border:  The edges of the growth are irregular. C for color:  A mixture of colors are present instead of an even brown color. D for diameter:  Melanomas are usually (but not always) greater than 6mm - the size of a pencil eraser. E for evolution:  The spot keeps changing in size, shape, and color.  Please check your skin once per month between visits. You can use a small mirror in front and a large mirror behind you to keep an eye on the back side or your body.   If you see any new or changing lesions before your next follow-up, please call to schedule a visit.  Please continue daily skin protection including broad spectrum sunscreen SPF 30+ to sun-exposed areas, reapplying every 2 hours as needed when you're outdoors.   Staying in the shade or wearing long sleeves, sun glasses (UVA+UVB protection) and wide brim hats (4-inch brim around the entire circumference of the hat) are also recommended for sun protection.     Due to recent changes in healthcare laws, you may see results  of your pathology and/or laboratory studies on MyChart before the doctors have had a chance to review them. We understand that in some cases there may be results that are confusing or concerning to you. Please understand that not all results are received at the same time and often the doctors may need to interpret multiple results in order to provide you with the best plan of care or course of treatment. Therefore, we ask that you please give [place] 2 business days to thoroughly review all your results before contacting the office for clarification. Should we see a critical lab result, you will be contacted sooner.   If You Need Anything After Your Visit  If you have any questions or concerns for your doctor, please call our main line at [phone] and press option 4 to reach your doctor's medical assistant. If no one answers, please leave a voicemail as directed and we will return your call as soon as possible. Messages left after 4 pm will be answered the following business day.   You may also send [place] a message via MyChart. We typically respond to MyChart messages within 1-2 business days.  For prescription refills, please ask your pharmacy to contact our office. Our fax number is [phone].  If you have an urgent issue when the clinic is closed that cannot wait until the next business day, you can page your doctor at the number below.  Please note that while we do our best to be available for urgent issues outside of office hours, we are not available 24/7.   If you have an urgent issue and are unable to reach [place], you may choose to seek medical care at your doctor's office, retail clinic, urgent care center, or emergency room.  If you have a medical emergency, please immediately call 911 or go to the emergency department.  Pager Numbers  - Dr. Gwen Pounds: [phone]  - Dr. Roseanne Reno: [phone]  - Dr. Katrinka Blazing: [phone]   In the event of inclement weather, please call our main line  at [phone] for an update on the status of any delays or closures.  Dermatology Medication Tips: Please keep the boxes that topical medications come in in order to help keep track of the instructions about where and how to use these. Pharmacies typically print the medication instructions only on the boxes and not directly on the medication tubes.   If your medication is too expensive, please contact our office at [phone] option 4 or send [place] a message through MyChart.   We are unable to tell what your co-pay for medications will be in advance as this is different depending on your insurance coverage. However, we may be able to find a substitute medication at lower cost or fill out paperwork to get insurance to cover a needed medication.   If a prior authorization is required to get your medication covered by your insurance company, please allow [place] 1-2 business days to complete this process.  Drug prices often vary depending on where the prescription is filled and some pharmacies may offer cheaper prices.  The website www.goodrx.com contains coupons for medications through different pharmacies. The prices here do not account for what the cost may be with help from insurance (it may be cheaper with your insurance), but the website can give you the price if you did not use any insurance.  - You can print the associated coupon and take it with your prescription to the pharmacy.  - You may also stop by our office during regular business hours and pick up a GoodRx coupon card.  - If you need your prescription sent electronically to a different pharmacy, notify our office through [facility] or by phone at [phone] option 4.     Si Usted Necesita Algo Despus de Su Visita  Tambin puede enviarnos un mensaje a travs de Clinical cytogeneticist. Por lo general respondemos a los mensajes de MyChart en el transcurso de 1 a 2 das hbiles.  Para renovar recetas, por favor pida a su farmacia que se  ponga en contacto con nuestra oficina. Annie Sable de fax es [place] [phone].  Si tiene un asunto urgente cuando la clnica est cerrada y que no puede esperar hasta el siguiente da hbil, puede llamar/localizar a su doctor(a) al nmero que aparece a continuacin.   Por favor, tenga en cuenta que aunque hacemos todo lo posible para estar disponibles para asuntos urgentes fuera del horario de [place], no estamos disponibles las 24 horas del da, los 7 [address]  [address] de la [place].   Si tiene un problema urgente y no puede comunicarse con nosotros, puede optar por buscar atencin mdica  en el consultorio de su doctor(a), en una clnica privada, en un centro de atencin urgente o en una sala de emergencias.  Si tiene Engineer, drilling, por favor llame inmediatamente al 911 o vaya a la sala de emergencias.  Nmeros de bper  - Dr.  Gwen Pounds: [phone]  - Dra. Roseanne Reno: [phone]  - Dr. Katrinka Blazing: [phone]   En caso de inclemencias del tiempo, por favor llame a Lacy Duverney principal al [phone] para una actualizacin sobre el [place] de cualquier retraso o cierre.  Consejos para la medicacin en dermatologa: Por favor, guarde las cajas en las que vienen los medicamentos de uso tpico para ayudarle a seguir las instrucciones sobre dnde y cmo usarlos. Las farmacias generalmente imprimen las instrucciones del medicamento slo en las cajas y no directamente en los tubos del [place].   Si su medicamento es muy caro, por favor, pngase en contacto con Rolm Gala llamando al [phone] y presione la opcin 4 o envenos un mensaje a travs de Clinical cytogeneticist.   No podemos decirle cul ser su copago por los medicamentos por adelantado ya que esto es diferente dependiendo de la cobertura de su seguro. Sin embargo, es posible que podamos encontrar un medicamento sustituto a Audiological scientist un formulario para que el seguro cubra el medicamento que se considera necesario.   Si se requiere  una autorizacin previa para que su compaa de seguros [place] su medicamento, por favor permtanos de 1 a 2 das hbiles para completar [address]  Los precios de los medicamentos varan con frecuencia dependiendo del Environmental consultant de dnde se surte la receta y alguna farmacias pueden ofrecer precios ms baratos.  El sitio web www.goodrx.com tiene cupones para medicamentos de Health and safety inspector. Los precios aqu no tienen en cuenta lo que podra costar con la ayuda del seguro (puede ser ms barato con su seguro), pero el sitio web puede darle el precio si no utiliz Tourist information centre manager.  - Puede imprimir el cupn correspondiente y llevarlo con su receta a la farmacia.  - Tambin puede pasar por nuestra oficina durante el horario de atencin regular y Education officer, museum una tarjeta de cupones de GoodRx.  - Si necesita que su receta se enve electrnicamente a una farmacia diferente, informe a nuestra oficina a travs de MyChart de [place] o por telfono llamando al [phone] y presione la opcin 4.

## 2024-01-19 NOTE — Progress Notes (Unsigned)
 Follow-Up Visit   Subjective  Heather Fisher is a 48 y.o. female who presents for the following: Skin Cancer Screening and Full Body Skin Exam. Redness at face, "pimple" like at posterior L shoulder ~ a while, at times ruptures last time was x couple months ago.   The patient presents for Total-Body Skin Exam (TBSE) for skin cancer screening and mole check. The patient has spots, moles and lesions to be evaluated, some may be new or changing and the patient may have concern these could be cancer.   The following portions of the chart were reviewed this encounter and updated as appropriate: medications, allergies, medical history  Review of Systems:  No other skin or systemic complaints except as noted in HPI or Assessment and Plan.  Objective  Well appearing patient in no apparent distress; mood and affect are within normal limits.  A full examination was performed including scalp, head, eyes, ears, nose, lips, neck, chest, axillae, abdomen, back, buttocks, bilateral upper extremities, bilateral lower extremities, hands, feet, fingers, toes, fingernails, and toenails. All findings within normal limits unless otherwise noted below.   Relevant physical exam findings are noted in the Assessment and Plan.    Assessment & Plan   SKIN CANCER SCREENING PERFORMED TODAY.  ACTINIC DAMAGE - Chronic condition, secondary to cumulative UV/sun exposure - diffuse scaly erythematous macules with underlying dyspigmentation - Recommend daily broad spectrum sunscreen SPF 30+ to sun-exposed areas, reapply every 2 hours as needed.  - Staying in the shade or wearing long sleeves, sun glasses (UVA+UVB protection) and wide brim hats (4-inch brim around the entire circumference of the hat) are also recommended for sun protection.  - Call for new or changing lesions.  LENTIGINES, SEBORRHEIC KERATOSES, HEMANGIOMAS - Benign normal skin lesions - Benign-appearing - Call for any changes  MELANOCYTIC  NEVI - Tan-brown and/or pink-flesh-colored symmetric macules and papules - Benign appearing on exam today - Observation - Call clinic for new or changing moles - Recommend daily use of broad spectrum spf 30+ sunscreen to sun-exposed areas.   Nevus -0.6cm brown macule at R medial foot, same size on exam today. -23mm at L buttock -8mm at L buttock -7mm at L buttock -9mm at R buttock   ROSACEA Exam Mid face erythema with telangiectasias +/- scattered inflammatory papules***  Chronic and persistent condition with duration or expected duration over one year. Condition is bothersome/symptomatic for patient. Currently flared.   Rosacea is a chronic progressive skin condition usually affecting the face of adults, causing redness and/or acne bumps. It is treatable but not curable. It sometimes affects the eyes (ocular rosacea) as well. It may respond to topical and/or systemic medication and can flare with stress, sun exposure, alcohol, exercise, topical steroids (including hydrocortisone/cortisone 10) and some foods.  Daily application of broad spectrum spf 30+ sunscreen to face is recommended to reduce flares.  Patient denies grittiness of the eyes***  Treatment Plan Metronidazole 0.75% cream BID to aa, can take up to 2 months to see full benefits. If bumps get severe discussed can do rounds of Doxycyline if needed.   Counseling for BBL / IPL / Laser and Coordination of Care Discussed the treatment option of Broad Band Light (BBL) /Intense Pulsed Light (IPL)/ Laser for skin discoloration, including brown spots and redness.  Typically we recommend at least 1-3 treatment sessions about 5-8 weeks apart for best results.  Cannot have tanned skin when BBL performed, and regular use of sunscreen/photoprotection is advised after the procedure to  help maintain results. The patient's condition may also require "maintenance treatments" in the future.  The fee for BBL / laser treatments is $350 per  treatment session for the whole face.  A fee can be quoted for other parts of the body.  Insurance typically does not pay for BBL/laser treatments and therefore the fee is an out-of-pocket cost. Recommend prophylactic valtrex treatment. Once scheduled for procedure, will send Rx in prior to patient's appointment.     -Recommend cosmetic dermatologist: Normand Sloop, M.D. [address] [place], [place] [address] [phone]  [email or web address]        Return in about 1 year (around 01/18/2025) for TBSE.  I, Soundra Pilon, CMA, am acting as scribe for Elie Goody, MD .   Documentation: I have reviewed the above documentation for accuracy and completeness, and I agree with the above.  Elie Goody, MD

## 2024-01-20 ENCOUNTER — Encounter: Payer: BC Managed Care – PPO | Admitting: Dermatology

## 2024-05-11 ENCOUNTER — Other Ambulatory Visit: Payer: Self-pay | Admitting: Family

## 2024-05-11 ENCOUNTER — Encounter: Payer: Self-pay | Admitting: Dermatology

## 2024-05-11 DIAGNOSIS — F419 Anxiety disorder, unspecified: Secondary | ICD-10-CM

## 2024-05-11 DIAGNOSIS — B009 Herpesviral infection, unspecified: Secondary | ICD-10-CM

## 2024-05-11 MED ORDER — VALACYCLOVIR HCL 1 G PO TABS: ORAL_TABLET | ORAL | 11 refills | Status: AC

## 2024-05-23 ENCOUNTER — Encounter: Payer: Self-pay | Admitting: Family

## 2024-06-10 ENCOUNTER — Encounter: Payer: Self-pay | Admitting: Family

## 2024-06-10 ENCOUNTER — Ambulatory Visit: Admitting: Family

## 2024-06-10 ENCOUNTER — Other Ambulatory Visit (HOSPITAL_COMMUNITY)
Admission: RE | Admit: 2024-06-10 | Discharge: 2024-06-10 | Disposition: A | Discharge status: Home / Self Care | Source: Ambulatory Visit | Attending: Family | Admitting: Family

## 2024-06-10 VITALS — BP 102/70 | HR 64 | Temp 98.0°F | Ht 65.0 in | Wt 187.6 lb

## 2024-06-10 DIAGNOSIS — R5383 Other fatigue: Secondary | ICD-10-CM | POA: Diagnosis not present

## 2024-06-10 DIAGNOSIS — Z8759 Personal history of other complications of pregnancy, childbirth and the puerperium: Secondary | ICD-10-CM

## 2024-06-10 DIAGNOSIS — D6851 Activated protein C resistance: Secondary | ICD-10-CM | POA: Diagnosis not present

## 2024-06-10 DIAGNOSIS — Z0000 Encounter for general adult medical examination without abnormal findings: Secondary | ICD-10-CM

## 2024-06-10 DIAGNOSIS — O99119 Other diseases of the blood and blood-forming organs and certain disorders involving the immune mechanism complicating pregnancy, unspecified trimester: Secondary | ICD-10-CM

## 2024-06-10 DIAGNOSIS — Z0001 Encounter for general adult medical examination with abnormal findings: Secondary | ICD-10-CM

## 2024-06-10 NOTE — Progress Notes (Unsigned)
 Assessment & Plan:  Routine physical examination Assessment & Plan: Clinical breast exam performed today.  Pap smear obtained.  Congratulated patient on diligence to exercise  Orders: -     VITAMIN D  25 Hydroxy (Vit-D Deficiency, Fractures) -     3D Screening Mammogram, Left and Right; Future -     Cytology - PAP -     Iron, TIBC and Ferritin Panel; Future -     Cortisol-am, blood; Future -     CBC with Differential/Platelet; Future -     Comprehensive metabolic panel with GFR; Future -     Hemoglobin A1c; Future -     Lipid panel; Future -     TSH; Future -     Insulin , random; Future -     B12 and Folate Panel; Future  Factor V Leiden mutation affecting pregnancy (HCC) Assessment & Plan: Previous history in pregnancy factor V leiden.  Referral to hematology for formal evaluation  Orders: -     Ambulatory referral to Hematology / Oncology  Other fatigue Assessment & Plan: Chronic fatigue. Etiology unclear. She declines imaging including CT Chest A/P at this time. Fortunately no B symptoms at this time.  We discussed OSA evaluation.  She preferred to start with comprehensive blood work as ordered today. Advised close follow up.       Return precautions given.   Risks, benefits, and alternatives of the medications and treatment plan prescribed today were discussed, and patient expressed understanding.   Education regarding symptom management and diagnosis given to patient on AVS either electronically or printed.  Return in about 3 months (around 09/10/2024).  Rollene Northern, FNP  Subjective:    Patient ID: Heather Fisher, female    DOB: 07-17-1976, 48 y.o.   MRN: [id number]  CC: Heather Fisher is a 48 y.o. female who presents today for physical exam.    HPI: HPI Discussed the use of AI scribe software for clinical note transcription with the patient, who gave verbal consent to proceed.  History of Present Illness   Heather Fisher is a 47 year old female who  presents for a physical exam and follow-up regarding weight concerns and concentration issues.  She has ongoing concerns about her weight, noting a family history of diabetes. Despite trying various methods to manage her weight, she is unsure if hormonal factors are affecting her progress. She mentions that summer is usually less stressful than the school year.  She is experiencing issues with concentration and forgetfulness. She notes that she has a lot on her mind and sometimes forgets simple tasks or what she was about to say.  She reports sleeping a lot, with a routine of going to bed around 10 PM and waking up at 4 AM. She describes feeling mentally and physically exhausted, sleep is sometimes restorative.  Denies fever, chills, unusual bone pain, changes in appetite.   Compliant with prozac  which is helpful for depression.  She mentions a history of factor V Leiden, identified in 2004 before her first child, but has not had testing to confirm this diagnosis. She is unsure if she saw hematology.     Colorectal Cancer Screening: UTD , 06/27/2022, Dr. Therisa.  Repeat in 10 years Breast Cancer Screening: Mammogram UTD Cervical Cancer Screening: due, obtained 07/02/2023, ASCUS, negative HPV       Tetanus - UTD Exercise: Gets regular exercise.   Alcohol use:  rare Smoking/tobacco use: former smoker.    Health Maintenance  Topic  Date Due   Hepatitis B Vaccine (1 of 3 - 19+ 3-dose series) Never done   COVID-19 Vaccine (3 - Moderna risk series) 02/14/2020   Flu Shot  01/24/2025*   Pap Smear  06/10/2025   DTaP/Tdap/Td vaccine (3 - Td or Tdap) 04/24/2027   Colon Cancer Screening  06/27/2032   Hepatitis C Screening  Completed   HIV Screening  Completed   HPV Vaccine  Aged Out   Meningitis B Vaccine  Aged Out   Pneumococcal Vaccine  Discontinued  *Topic was postponed. The date shown is not the original due date.    ALLERGIES: Penicillins and Sulfonamide derivatives  Current Outpatient  Medications on File Prior to Visit  Medication Sig Dispense Refill   cetirizine (ZYRTEC) 10 MG tablet Take 10 mg by mouth daily.     Cholecalciferol (VITAMIN D3) 10 MCG (400 UNIT) tablet Take 400 Units by mouth daily.     cyanocobalamin  (VITAMIN B12) 1000 MCG tablet Take 1,000 mcg by mouth daily.     FLUoxetine  (PROZAC ) 20 MG capsule TAKE 1 CAPSULE BY MOUTH EVERY DAY IN THE MORNING 90 capsule 0   metroNIDAZOLE  (METROCREAM ) 0.75 % cream Apply topically 2 (two) times daily. 45 g 0   Specialty Vitamins Products (MAGNESIUM, AMINO ACID CHELATE,) 133 MG tablet Take 1 tablet by mouth daily.     valACYclovir  (VALTREX ) 1000 MG tablet TAKE 2 TABLETS AT ONSET THEN TAKE 2 TABLETS 12 HOURS LATER 20 tablet 11   No current facility-administered medications on file prior to visit.    Review of Systems  Constitutional:  Positive for fatigue. Negative for chills, fever and unexpected weight change.  HENT:  Negative for congestion.   Respiratory:  Negative for cough.   Cardiovascular:  Negative for chest pain, palpitations and leg swelling.  Gastrointestinal:  Negative for nausea and vomiting.  Musculoskeletal:  Negative for arthralgias and myalgias.  Skin:  Negative for rash.  Neurological:  Negative for headaches.  Hematological:  Negative for adenopathy.  Psychiatric/Behavioral:  Negative for confusion.       Objective:    BP 102/70   Pulse 64   Temp 98 F (36.7 C) (Oral)   Ht 5' 5 (1.651 m)   Wt 187 lb 9.6 oz (85.1 kg)   LMP  (LMP Unknown)   SpO2 98%   BMI 31.22 kg/m   BP Readings from Last 3 Encounters:  06/10/24 102/70  07/02/23 120/76  06/02/23 118/78   Wt Readings from Last 3 Encounters:  06/10/24 187 lb 9.6 oz (85.1 kg)  07/02/23 177 lb 14.4 oz (80.7 kg)  06/02/23 181 lb 6.4 oz (82.3 kg)    Physical Exam Vitals reviewed.  Constitutional:      Appearance: Normal appearance. She is well-developed.  Eyes:     Conjunctiva/sclera: Conjunctivae normal.  Neck:     Thyroid: No  thyroid mass or thyromegaly.  Cardiovascular:     Rate and Rhythm: Normal rate and regular rhythm.     Pulses: Normal pulses.     Heart sounds: Normal heart sounds.  Pulmonary:     Effort: Pulmonary effort is normal.     Breath sounds: Normal breath sounds. No wheezing, rhonchi or rales.  Chest:  Breasts:    Breasts are symmetrical.     Right: No inverted nipple, mass, nipple discharge, skin change or tenderness.     Left: No inverted nipple, mass, nipple discharge, skin change or tenderness.  Abdominal:     General: Bowel sounds are normal.  There is no distension.     Palpations: Abdomen is soft. Abdomen is not rigid. There is no fluid wave or mass.     Tenderness: There is no abdominal tenderness. There is no guarding or rebound.  Genitourinary:    Cervix: No cervical motion tenderness, discharge or friability.     Uterus: Not enlarged, not fixed and not tender.      Adnexa:        Right: No mass, tenderness or fullness.         Left: No mass, tenderness or fullness.       Comments: Pap performed. No CMT. Unable to appreciated ovaries. Lymphadenopathy:     Head:     Right side of head: No submental, submandibular, tonsillar, preauricular, posterior auricular or occipital adenopathy.     Left side of head: No submental, submandibular, tonsillar, preauricular, posterior auricular or occipital adenopathy.     Cervical:     Right cervical: No superficial, deep or posterior cervical adenopathy.    Left cervical: No superficial, deep or posterior cervical adenopathy.     Upper Body:     Right upper body: No pectoral adenopathy.     Left upper body: No pectoral adenopathy.  Skin:    General: Skin is warm and dry.  Neurological:     Mental Status: She is alert.  Psychiatric:        Speech: Speech normal.        Behavior: Behavior normal.        Thought Content: Thought content normal.

## 2024-06-12 NOTE — Assessment & Plan Note (Signed)
 Previous history in pregnancy factor V leiden.  Referral to hematology for formal evaluation

## 2024-06-12 NOTE — Assessment & Plan Note (Signed)
Clinical breast exam performed today.  Pap smear obtained.  Congratulated patient on diligence to exercise

## 2024-06-14 ENCOUNTER — Other Ambulatory Visit (INDEPENDENT_AMBULATORY_CARE_PROVIDER_SITE_OTHER)

## 2024-06-14 DIAGNOSIS — Z0000 Encounter for general adult medical examination without abnormal findings: Secondary | ICD-10-CM | POA: Diagnosis not present

## 2024-06-14 LAB — CBC WITH DIFFERENTIAL/PLATELET
Basophils Absolute: 0 K/uL (ref 0.0–0.1)
Basophils Relative: 0.7 % (ref 0.0–3.0)
Eosinophils Absolute: 0.2 K/uL (ref 0.0–0.7)
Eosinophils Relative: 4.1 % (ref 0.0–5.0)
HCT: 39.2 % (ref 36.0–46.0)
Hemoglobin: 13.2 g/dL (ref 12.0–15.0)
Lymphocytes Relative: 28.9 % (ref 12.0–46.0)
Lymphs Abs: 1.1 K/uL (ref 0.7–4.0)
MCHC: 33.8 g/dL (ref 30.0–36.0)
MCV: 89.9 fl (ref 78.0–100.0)
Monocytes Absolute: 0.3 K/uL (ref 0.1–1.0)
Monocytes Relative: 9.2 % (ref 3.0–12.0)
Neutro Abs: 2.2 K/uL (ref 1.4–7.7)
Neutrophils Relative %: 57.1 % (ref 43.0–77.0)
Platelets: 272 K/uL (ref 150.0–400.0)
RBC: 4.36 Mil/uL (ref 3.87–5.11)
RDW: 13.5 % (ref 11.5–15.5)
WBC: 3.8 K/uL — ABNORMAL LOW (ref 4.0–10.5)

## 2024-06-14 LAB — COMPREHENSIVE METABOLIC PANEL WITH GFR
ALT: 14 U/L (ref 0–35)
AST: 16 U/L (ref 0–37)
Albumin: 4.4 g/dL (ref 3.5–5.2)
Alkaline Phosphatase: 52 U/L (ref 39–117)
BUN: 12 mg/dL (ref 6–23)
CO2: 27 meq/L (ref 19–32)
Calcium: 9.5 mg/dL (ref 8.4–10.5)
Chloride: 103 meq/L (ref 96–112)
Creatinine, Ser: 0.86 mg/dL (ref 0.40–1.20)
GFR: 79.77 mL/min (ref >60.00)
Glucose, Bld: 92 mg/dL (ref 70–99)
Potassium: 4.6 meq/L (ref 3.5–5.1)
Sodium: 137 meq/L (ref 135–145)
Total Bilirubin: 0.5 mg/dL (ref 0.2–1.2)
Total Protein: 7.2 g/dL (ref 6.0–8.3)

## 2024-06-14 LAB — LIPID PANEL
Cholesterol: 219 mg/dL — ABNORMAL HIGH (ref 0–200)
HDL: 59.8 mg/dL (ref >39.00)
LDL Cholesterol: 142 mg/dL — ABNORMAL HIGH (ref 0–99)
NonHDL: 159.17
Total CHOL/HDL Ratio: 4
Triglycerides: 87 mg/dL (ref 0.0–149.0)
VLDL: 17.4 mg/dL (ref 0.0–40.0)

## 2024-06-14 LAB — HEMOGLOBIN A1C: Hgb A1c MFr Bld: 5.5 % (ref 4.6–6.5)

## 2024-06-14 LAB — B12 AND FOLATE PANEL
Folate: >23.4 ng/mL (ref >5.9)
Vitamin B-12: 1080 pg/mL — ABNORMAL HIGH (ref 211–911)

## 2024-06-14 LAB — TSH: TSH: 2.01 u[IU]/mL (ref 0.35–5.50)

## 2024-06-15 LAB — IRON,TIBC AND FERRITIN PANEL
%SAT: 19 % (ref 16–45)
Ferritin: 26 ng/mL (ref 16–232)
Iron: 73 ug/dL (ref 40–190)
TIBC: 378 ug/dL (ref 250–450)

## 2024-06-15 LAB — CORTISOL-AM, BLOOD: Cortisol - AM: 18.6 ug/dL

## 2024-06-15 LAB — CYTOLOGY - PAP
Comment: NEGATIVE
Diagnosis: NEGATIVE
High risk HPV: NEGATIVE

## 2024-06-15 LAB — INSULIN, RANDOM: Insulin: 7.3 u[IU]/mL

## 2024-06-16 ENCOUNTER — Ambulatory Visit: Payer: Self-pay | Admitting: Family

## 2024-06-16 NOTE — Telephone Encounter (Signed)
 Copied from CRM #[id number]. Topic: Clinical - Lab/Test Results >> Jun 16, 2024  4:07 PM Taleah C wrote: Reason for CRM: ptcalled to return call regarding labs. I relayed results and she stated that she rarely eats the foods that she is being told to avoid. Please advise.

## 2024-06-17 NOTE — Assessment & Plan Note (Addendum)
 Chronic fatigue. Etiology unclear. She declines imaging including CT Chest A/P at this time. Fortunately no B symptoms at this time.  We discussed OSA evaluation.  She preferred to start with comprehensive blood work as ordered today. Advised close follow up.

## 2024-06-17 NOTE — Patient Instructions (Signed)
 Referral to hematology Let us  know if you dont hear back within 2 weeks in regards to an appointment being scheduled.   So that you are aware, if you are [facility] MyChart user , please pay attention to your MyChart messages as you may receive a MyChart message with a phone number to call and schedule this test/appointment own your own from our referral coordinator. This is a new process so I do not want you to miss this message.  If you are not a MyChart user, you will receive a phone call.    Health Maintenance for Postmenopausal Women Menopause is a normal process in which your ability to get pregnant comes to an end. This process happens slowly over many months or years, usually between the ages of 33 and 72. Menopause is complete when you have missed your menstrual period for 12 months. It is important to talk with your health care provider about some of the most common conditions that affect women after menopause (postmenopausal women). These include heart disease, cancer, and bone loss (osteoporosis). Adopting a healthy lifestyle and getting preventive care can help to promote your health and wellness. The actions you take can also lower your chances of developing some of these common conditions. What are the signs and symptoms of menopause? During menopause, you may have the following symptoms: Hot flashes. These can be moderate or severe. Night sweats. Decrease in sex drive. Mood swings. Headaches. Tiredness (fatigue). Irritability. Memory problems. Problems falling asleep or staying asleep. Talk with your health care provider about treatment options for your symptoms. Do I need hormone replacement therapy? Hormone replacement therapy is effective in treating symptoms that are caused by menopause, such as hot flashes and night sweats. Hormone replacement carries certain risks, especially as you become older. If you are thinking about using estrogen or estrogen with progestin, discuss the  benefits and risks with your health care provider. How can I reduce my risk for heart disease and stroke? The risk of heart disease, heart attack, and stroke increases as you age. One of the causes may be a change in the body's hormones during menopause. This can affect how your body uses dietary fats, triglycerides, and cholesterol. Heart attack and stroke are medical emergencies. There are many things that you can do to help prevent heart disease and stroke. Watch your blood pressure High blood pressure causes heart disease and increases the risk of stroke. This is more likely to develop in people who have high blood pressure readings or are overweight. Have your blood pressure checked: Every 3-5 years if you are 20-34 years of age. Every year if you are 48 years old or older. Eat a healthy diet  Eat a diet that includes plenty of vegetables, fruits, low-fat dairy products, and lean protein. Do not eat a lot of foods that are high in solid fats, added sugars, or sodium. Get regular exercise Get regular exercise. This is one of the most important things you can do for your health. Most adults should: Try to exercise for at least 150 minutes each week. The exercise should increase your heart rate and make you sweat (moderate-intensity exercise). Try to do strengthening exercises at least twice each week. Do these in addition to the moderate-intensity exercise. Spend less time sitting. Even light physical activity can be beneficial. Other tips Work with your health care provider to achieve or maintain a healthy weight. Do not use any products that contain nicotine or tobacco. These products include cigarettes, chewing  tobacco, and vaping devices, such as e-cigarettes. If you need help quitting, ask your health care provider. Know your numbers. Ask your health care provider to check your cholesterol and your blood sugar (glucose). Continue to have your blood tested as directed by your health care  provider. Do I need screening for cancer? Depending on your health history and family history, you may need to have cancer screenings at different stages of your life. This may include screening for: Breast cancer. Cervical cancer. Lung cancer. Colorectal cancer. What is my risk for osteoporosis? After menopause, you may be at increased risk for osteoporosis. Osteoporosis is a condition in which bone destruction happens more quickly than new bone creation. To help prevent osteoporosis or the bone fractures that can happen because of osteoporosis, you may take the following actions: If you are 60-56 years old, get at least 1,000 mg of calcium and at least 600 international units (IU) of vitamin D  per day. If you are older than age 18 but younger than age 81, get at least 1,200 mg of calcium and at least 600 international units (IU) of vitamin D  per day. If you are older than age 54, get at least 1,200 mg of calcium and at least 800 international units (IU) of vitamin D  per day. Smoking and drinking excessive alcohol increase the risk of osteoporosis. Eat foods that are rich in calcium and vitamin D , and do weight-bearing exercises several times each week as directed by your health care provider. How does menopause affect my mental health? Depression may occur at any age, but it is more common as you become older. Common symptoms of depression include: Feeling depressed. Changes in sleep patterns. Changes in appetite or eating patterns. Feeling an overall lack of motivation or enjoyment of activities that you previously enjoyed. Frequent crying spells. Talk with your health care provider if you think that you are experiencing any of these symptoms. General instructions See your health care provider for regular wellness exams and vaccines. This may include: Scheduling regular health, dental, and eye exams. Getting and maintaining your vaccines. These include: Influenza vaccine. Get this vaccine  each year before the flu season begins. Pneumonia vaccine. Shingles vaccine. Tetanus, diphtheria, and pertussis (Tdap) booster vaccine. Your health care provider may also recommend other immunizations. Tell your health care provider if you have ever been abused or do not feel safe at home. Summary Menopause is a normal process in which your ability to get pregnant comes to an end. This condition causes hot flashes, night sweats, decreased interest in sex, mood swings, headaches, or lack of sleep. Treatment for this condition may include hormone replacement therapy. Take actions to keep yourself healthy, including exercising regularly, eating a healthy diet, watching your weight, and checking your blood pressure and blood sugar levels. Get screened for cancer and depression. Make sure that you are up to date with all your vaccines. This information is not intended to replace advice given to you by your health care provider. Make sure you discuss any questions you have with your health care provider. Document Revised: 03/04/2021 Document Reviewed: 03/04/2021 Elsevier Patient Education  2024 [employer].

## 2024-08-05 ENCOUNTER — Other Ambulatory Visit: Payer: Self-pay | Admitting: Family

## 2024-08-05 DIAGNOSIS — F32A Depression, unspecified: Secondary | ICD-10-CM

## 2024-09-15 ENCOUNTER — Ambulatory Visit: Admitting: Family

## 2024-09-19 ENCOUNTER — Other Ambulatory Visit: Payer: Self-pay | Admitting: Dermatology

## 2024-11-06 ENCOUNTER — Other Ambulatory Visit: Payer: Self-pay | Admitting: Family

## 2024-11-06 DIAGNOSIS — F32A Depression, unspecified: Secondary | ICD-10-CM

## 2024-11-09 ENCOUNTER — Encounter: Payer: Self-pay | Admitting: Family

## 2024-11-10 ENCOUNTER — Encounter: Payer: Self-pay | Admitting: Family

## 2024-11-11 NOTE — Telephone Encounter (Signed)
 Printed letter for pt pt has been notified that she can come pick up placed up front in the brown folder

## 2025-01-18 ENCOUNTER — Ambulatory Visit: Admitting: Dermatology

## 2025-01-19 ENCOUNTER — Ambulatory Visit: Admitting: Dermatology
